# Patient Record
Sex: Female | Born: 1975 | Race: White | Hispanic: No | Smoking: Current every day smoker
Health system: Southern US, Community
[De-identification: ages and names within clinical notes are randomized; demographics above are authoritative.]

## PROBLEM LIST (undated history)

## (undated) DIAGNOSIS — N289 Disorder of kidney and ureter, unspecified: Secondary | ICD-10-CM

## (undated) DIAGNOSIS — G43909 Migraine, unspecified, not intractable, without status migrainosus: Secondary | ICD-10-CM

## (undated) DIAGNOSIS — J45909 Unspecified asthma, uncomplicated: Secondary | ICD-10-CM

## (undated) DIAGNOSIS — J189 Pneumonia, unspecified organism: Secondary | ICD-10-CM

## (undated) DIAGNOSIS — F319 Bipolar disorder, unspecified: Secondary | ICD-10-CM

## (undated) DIAGNOSIS — G8929 Other chronic pain: Secondary | ICD-10-CM

## (undated) DIAGNOSIS — F1410 Cocaine abuse, uncomplicated: Secondary | ICD-10-CM

## (undated) DIAGNOSIS — M549 Dorsalgia, unspecified: Secondary | ICD-10-CM

## (undated) HISTORY — PX: FINGER SURGERY: SHX640

## (undated) HISTORY — PX: MOUTH SURGERY: SHX715

---

## 2001-08-01 ENCOUNTER — Inpatient Hospital Stay (HOSPITAL_COMMUNITY): Admission: EM | Admit: 2001-08-01 | Discharge: 2001-08-05 | Payer: Self-pay | Admitting: Psychiatry

## 2002-05-31 ENCOUNTER — Ambulatory Visit (HOSPITAL_COMMUNITY): Admission: RE | Admit: 2002-05-31 | Discharge: 2002-05-31 | Payer: Self-pay | Admitting: *Deleted

## 2002-06-03 ENCOUNTER — Emergency Department (HOSPITAL_COMMUNITY): Admission: EM | Admit: 2002-06-03 | Discharge: 2002-06-03 | Payer: Self-pay | Admitting: Emergency Medicine

## 2003-02-09 ENCOUNTER — Inpatient Hospital Stay (HOSPITAL_COMMUNITY): Admission: AD | Admit: 2003-02-09 | Discharge: 2003-02-12 | Payer: Self-pay | Admitting: *Deleted

## 2003-05-20 ENCOUNTER — Emergency Department (HOSPITAL_COMMUNITY): Admission: EM | Admit: 2003-05-20 | Discharge: 2003-05-20 | Payer: Self-pay | Admitting: Emergency Medicine

## 2005-09-09 ENCOUNTER — Emergency Department (HOSPITAL_COMMUNITY): Admission: EM | Admit: 2005-09-09 | Discharge: 2005-09-09 | Payer: Self-pay | Admitting: Emergency Medicine

## 2007-03-22 ENCOUNTER — Emergency Department (HOSPITAL_COMMUNITY): Admission: EM | Admit: 2007-03-22 | Discharge: 2007-03-22 | Payer: Self-pay | Admitting: Emergency Medicine

## 2007-04-01 ENCOUNTER — Emergency Department (HOSPITAL_COMMUNITY): Admission: EM | Admit: 2007-04-01 | Discharge: 2007-04-01 | Payer: Self-pay | Admitting: Emergency Medicine

## 2007-07-01 ENCOUNTER — Emergency Department (HOSPITAL_COMMUNITY): Admission: EM | Admit: 2007-07-01 | Discharge: 2007-07-01 | Payer: Self-pay | Admitting: Emergency Medicine

## 2007-08-17 ENCOUNTER — Emergency Department (HOSPITAL_COMMUNITY): Admission: EM | Admit: 2007-08-17 | Discharge: 2007-08-17 | Payer: Self-pay | Admitting: Emergency Medicine

## 2007-09-13 ENCOUNTER — Emergency Department (HOSPITAL_COMMUNITY): Admission: EM | Admit: 2007-09-13 | Discharge: 2007-09-13 | Payer: Self-pay | Admitting: Emergency Medicine

## 2007-12-06 ENCOUNTER — Emergency Department (HOSPITAL_COMMUNITY): Admission: EM | Admit: 2007-12-06 | Discharge: 2007-12-06 | Payer: Self-pay | Admitting: Emergency Medicine

## 2007-12-12 ENCOUNTER — Inpatient Hospital Stay (HOSPITAL_COMMUNITY): Admission: EM | Admit: 2007-12-12 | Discharge: 2007-12-16 | Payer: Self-pay | Admitting: Emergency Medicine

## 2008-01-31 ENCOUNTER — Emergency Department (HOSPITAL_COMMUNITY): Admission: EM | Admit: 2008-01-31 | Discharge: 2008-01-31 | Payer: Self-pay | Admitting: Emergency Medicine

## 2008-04-19 ENCOUNTER — Emergency Department (HOSPITAL_COMMUNITY): Admission: EM | Admit: 2008-04-19 | Discharge: 2008-04-19 | Payer: Self-pay | Admitting: Emergency Medicine

## 2008-05-30 ENCOUNTER — Emergency Department (HOSPITAL_COMMUNITY): Admission: EM | Admit: 2008-05-30 | Discharge: 2008-05-30 | Payer: Self-pay | Admitting: Emergency Medicine

## 2008-11-03 ENCOUNTER — Emergency Department (HOSPITAL_COMMUNITY): Admission: EM | Admit: 2008-11-03 | Discharge: 2008-11-03 | Payer: Self-pay | Admitting: Emergency Medicine

## 2009-04-09 ENCOUNTER — Ambulatory Visit (HOSPITAL_COMMUNITY): Admission: RE | Admit: 2009-04-09 | Discharge: 2009-04-09 | Payer: Self-pay | Admitting: Internal Medicine

## 2009-04-24 ENCOUNTER — Ambulatory Visit (HOSPITAL_COMMUNITY): Admission: RE | Admit: 2009-04-24 | Discharge: 2009-04-24 | Payer: Self-pay | Admitting: Internal Medicine

## 2010-02-23 ENCOUNTER — Encounter: Payer: Self-pay | Admitting: *Deleted

## 2010-02-24 ENCOUNTER — Encounter: Payer: Self-pay | Admitting: Internal Medicine

## 2010-02-24 ENCOUNTER — Encounter: Payer: Self-pay | Admitting: Family Medicine

## 2010-06-18 NOTE — Discharge Summary (Signed)
NAME:  Christine Vargas, Christine Vargas NO.:  [id number]   MEDICAL RECORD NO.:  [id number]          PATIENT TYPE:  INP   LOCATION:  5039                         FACILITY:  [facility]   PHYSICIAN:  Madelynn Done, MD  DATE OF BIRTH:  07/23/1975   DATE OF ADMISSION:  12/11/2007  DATE OF DISCHARGE:  12/16/2007                               DISCHARGE SUMMARY   ADMISSION DIAGNOSES:  1. Left ring finger abscess and flexor tenosynovitis.  2. Tobacco use.   DISCHARGE DIAGNOSES:  1. Left ring finger abscess and flexor tenosynovitis.  2. Tobacco use.   CONDITION AT DISCHARGE:  Good.   SURGICAL PROCEDURES:  Left ring finger abscess and drainage as well as  incision and drainage of the flexor tendon sheath on December 12, 2007.   DISCHARGE MEDICATIONS:  1. Doxycycline 100 mg p.o. twice a day.  2. Ciprofloxacin 500 mg p.o. b.i.d.  3. Percocet 1-2 tablets every 4-6 hours as needed for pain.   REASON FOR ADMISSION:  The patient presented to the Emergency Department  after worsening infection to her left ring finger.  She was treated in  [place] and sent down from [facility] with a worsening  infection.  The patient was seen and evaluated and felt needed to go,  admitted and taken to the operating room on the day of presentation  secondary to the finger infection.   HOSPITAL COURSE:  The patient was admitted to the Orthopedic Floor  following the above procedure.  Her wound cultures grew out of  panculture micro-organism both anaerobic and gram-positive and gram-  negative organisms.  The patient was placed on oral ciprofloxacin as  well as doxycycline for the treatment.  Wound care was initiated in the  hospital with occupational therapy.  The patient was seen and examined  on hospital day #4.  She was felt ready to be discharged to home.  Her  wound looked a lot better.   HOSPITAL RECOMMENDATIONS AND DISPOSITION:  The patient will be  discharged to home.  She will be seen  back in the office in  approximately 3-4 days for repeat wound check and evaluation, continue  with above  discharge medications.  She is to return to clinic soon if she has any  worsening fevers, chills, nausea, vomiting, pain, or problems with the  hand.  Prior to her discharge, the patient's discharge instructions were  explained.  Her questions were answered.  The patient voiced  understanding the discharge instructions.      Madelynn Done, MD  Electronically Signed     FWO/MEDQ  D:  01/31/2008  T:  02/01/2008  Job:  [id number]

## 2010-06-18 NOTE — Op Note (Signed)
NAME:  Christine Vargas, Christine Vargas NO.:  [id number]   MEDICAL RECORD NO.:  [id number]          PATIENT TYPE:  INP   LOCATION:  5039                         FACILITY:  [facility]   PHYSICIAN:  Madelynn Done, MD  DATE OF BIRTH:  02/20/1975   DATE OF PROCEDURE:  12/12/2007  DATE OF DISCHARGE:                               OPERATIVE REPORT   PREOPERATIVE DIAGNOSES:  Left ring finger abscess, flexor tenosynovitis.   POSTOPERATIVE DIAGNOSES:  Left ring finger abscess, flexor  tenosynovitis.   ATTENDING SURGEON:  Madelynn Done, MD, who was scrubbed and present  for the entire procedure.   ASSISTANT SURGEON:  None.   SURGICAL PROCEDURES:  1. Drainage of a complicated and deep abscess, left ring finger.  2. Left ring finger flexor tendon sheath exploration and tendon sheath      drainage.   ANESTHESIA:  General via LMA.   INTRAOPERATIVE CULTURES:  X1, aerobic and anaerobic.   SURGICAL INDICATIONS:  Christine Vargas is a 35 year old right-hand-  dominant female who sustained a sharp laceration to her left ring finger  within this past week.  She was seen at [facility], seen by  another orthopedist, and told that if the finger got worse she was  instructed to come to the [facility].  I was consulted and saw  the patient in the emergency department and after evaluation, as the  patient was noted to have a deep abscess in the finger and possible  flexor tenosynovitis; therefore, she was taken to the operating room on  an urgent basis.  Risks, benefits, and alternatives were discussed in  detail with the patient and a signed informed consent was obtained.   Risks include but are not limited to bleeding, infection, damage to  nearby nerves, arteries, or tendons, persistent infection, stiffness in  the finger, and need for further surgical intervention.   DESCRIPTION OF PROCEDURE:  The patient was appropriately identified in  the preop holding area and a mark  with a permanent marker was made on  the left ring finger to indicate correct operative site.  The patient  was then brought back to the operating room and placed supine on the  anesthesia room table.  General anesthesia was administered via LMA.  The patient tolerated this well.  A well-padded tourniquet was then  placed on the left forearm and sealed with a 1000 drape.  The left upper  extremity was prepped with Betadine and sterilely draped.  A time-out  was called, correct site was identified, and the procedure was then  begun.  The patient did have a wound over the radial aspect of the ring  finger.  This area was extended both proximally and distally and a mid  axial incision was then made to the digit.  The neurovascular bundle was  identified and retracted volarly.  Gross purulence was encountered and  the abscess area was then drained and then wound cultures were taken.  Thorough irrigation and debridement and drainage of the abscess region  was then carried out.  The infection tracked down to the region of the  flexor  tendon sheath and the flexor tendon sheath was then opened in the  region of the A4 pulley.  The flexor tendon sheath was then opened and  thoroughly drained and washed out around the incision area.  After  drainage of the tendon sheath, the wound was then thoroughly irrigated  with 3 liters of irrigation.  A small blue vessel loop was then applied,  exiting out dorsally and then the wound was loosely reapproximated and  closed with simple 4-0 nylon sutures, 3 sutures were then used.  Adaptic  dressing and sterile compressive dressing was then applied to the  finger.  The patient tolerated this well.  She was then extubated and  taken to the recovery room in good condition.   POSTOPERATIVE PLAN:  The patient will be admitted for IV antibiotics and  await her cultures.  Wound check in approximately 48 hours.  If she  improves, may consider an oral antibiotic regimen  depending on the  cultures.  If she does not improve, the patient may need further  intervention.      Madelynn Done, MD  Electronically Signed     FWO/MEDQ  D:  12/12/2007  T:  12/12/2007  Job:  [id number]
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
## 2010-06-21 NOTE — Op Note (Signed)
NAME:  Christine Vargas, Christine Vargas                     ACCOUNT NO.:  [id number]   MEDICAL RECORD NO.:  [id number]                   PATIENT TYPE:  INP   LOCATION:  A416                                 FACILITY:  [facility]   PHYSICIAN:  Langley Gauss, M.D.                DATE OF BIRTH:  [birth year]   DATE OF PROCEDURE:  02/09/2003  DATE OF DISCHARGE:                                 OPERATIVE REPORT   DELIVERY NOTE   DIAGNOSES:  1. A 37-2/7 weeks intrauterine pregnancy.  2. Vaginal bleeding, unspecified.  3. History of cocaine use during the pregnancy, testing positive for cocaine     upon presentation in labor.  4. History of Chlamydia, treated x2 during this pregnancy.  Most recent     treatment rendered February 02, 2003 with Zithromax 1 gm p.o. x1.   PROCEDURE PERFORMED:  Spontaneous assisted vaginal delivery of a female  infant delivered over an intact perineum.  Delivery performed by Dr. Roylene Reason. Lisette Grinder.   ESTIMATED BLOOD LOSS:  Less than 500 cc.   SPECIMENS:  1. Arterial cord gas and cord blood to laboratory.  2. Placenta is examined and noted to be small but intact with a 3-vessel     umbilical cord. Placenta is sent to pathology laboratory for further     study due to the very small nature of the infant delivered.   SUMMARY:  The patient had very minimal care, presented late for prenatal  care; was very noncompliant with the prenatal care, in that a screening  ultrasound had been performed in the office for dates only.  The patient  failed to keep an appointment for an anatomic survey to be performed at  [facility].  The patient, likewise, noted to test positive for  cocaine during the pregnancy.  She was consulted extensively regarding the  adverse effects of cocaine on the pregnancy; and she also was informed that  she will be tested upon presentation in labor.  The patient likewise was  noted to test positive for Chlamydia.  She was treated with Zithromax, but  then  when retested was again noted to be testing positive.  Most recent  treatment, as stated previously, February 02, 2003 Zithromax 1 gm p.o. x1.   On this date, the patient states that she had contractions during the night,  actually did not sleep at all, secondary to the discomfort.  The final  precipitating factor was that she had vaginal bleeding on the day of  presentation.  She actually called the office complaining of vaginal  bleeding thinking that her water was broken.  She was advised to present to  [facility] at that time, which she subsequently did.   Upon evaluation at [facility] she was noted to have intact  membranes, irregular uterine contractions by external fetal monitor,  reassuring fetal heart rate, buy upon examination was noted to be 6 cm  dilated,  completely effaced and 0 station.  She was, thus, admitted in  labor.  She was noted to be GBS positive and was treated with IV Cleocin  during the course of labor secondary to PENICILLIN allergy.   Upon reaching 8 cm amniotomy was performed with placement of fetal scalp  electrode with findings of clear amniotic fluid.  The patient was noted to  have a dysfunctional labor pattern with inadequate frequency and intensity  of uterine contractions requiring Pitocin augmentation.  Thereafter she  progressed slowly along the labor curve to 9+ cm dilatation.  She did have  significant back pain during the course of labor. She requested IV Demerol;  however, due to the long half-life and active metabolite in this already  compromised infant secondary to the cocaine exposure during pregnancy it was  felt that the use of  Demerol was contraindicated.  The patient declined the  use of Nubain or Stadol, undoubtedly secondary to previous exposure to these  medications and the lack of analgesic effect secondary to her opiate  exposure. The patient did, however, reach complete dilatation.   She, thereafter, began having  the urge to push and was placed in the dorsal  lithotomy position.  She pushed during a short second stage of labor with  spontaneous rotation and then delivered in a direct OA position over an  intact perineum.  Mouth and nares were bulb suctioned of clear amniotic  fluid.  Spontaneous rotation occurred to a right anterior shoulder position.  Gentle downward traction combined with expulsive efforts results in delivery  of the infant without difficulty.  Delivery itself was uncomplicated.   The umbilical cord is milked towards the infant.  The cord is doubly  clamped, and cut; and the infant is taken to the nursery table for immediate  assessment.  Arterial cord gas and cord blood are then obtained.  Gentle  traction on the umbilical cord results in separation which, upon  examination, is an intact placenta though it is noted to be very small.  Likewise the infant is noted to be very small in size, weight is currently  pending.  Gentle traction on the umbilical cord results in separation of the  placenta with no excessive bleeding noted.   Examination of the genital tract reveals an intact perineum.  Notably the  nursing staff is informed of the recent positive Chlamydia status;  erythromycin ointment is administered and the pediatrician is to be aware of  recent positive Chlamydia culture treated, likewise of positive cocaine  testing upon presentation in labor.  GBS positive status and best  gestational age we have is 37-2/[redacted] weeks gestation. The patient, herself does  plan on bottle feeding.      ___________________________________________                                            Langley Gauss, M.D.   DC/MEDQ  D:  02/09/2003  T:  02/10/2003  Job:  [id number]

## 2010-06-21 NOTE — Discharge Summary (Signed)
NAME:  Christine Vargas, Christine Vargas                     ACCOUNT NO.:  [id number]   MEDICAL RECORD NO.:  [id number]                   PATIENT TYPE:  INP   LOCATION:  A416                                 FACILITY:  [facility]   PHYSICIAN:  Langley Gauss, M.D.                DATE OF BIRTH:  1975/02/22   DATE OF ADMISSION:  02/09/2003  DATE OF DISCHARGE:  02/12/2003                                 DISCHARGE SUMMARY   PROCEDURE PERFORMED:  Spontaneous assisted vaginal delivery performed on  February 09, 2003. See previous dictations.   PERTINENT LABORATORY STUDIES:  A positive blood type.  Cocaine positive  urine drug screen upon admission. GC and Chlamydia both negative upon  admission. Hemoglobin 14.9, hematocrit 42.9, white count 14.6. On postpartum  day #1, 11.7/33.9.   The patient is given a copy of standardized discharge instructions. She will  follow up in the office in three weeks time. She does desire permanent  sterilization. On the day of discharge, permanent sterilization was  discussed with the patient, she understands that there are reversible forms  of birth control available, but she adamantly desires permanent and  irreversible sterilization. Thus, she did sign the 30-day Medicaid papers  regarding tubal sterilization, although she does claim to be covered by a  [employer]. She is both bottle and breast feeding.   DISCHARGE MEDICATIONS:  Motrin 400 mg q.i.d. p.r.n.   HOSPITAL COURSE:  See previous dictations. The patient delivered on the p.m.  of February 09, 2003. Due to the positive screen she did not receive any  narcotics during the course of labor and she refused epidural analgesia. She  delivered in an uncomplicated manner. She was noted to be GBS positive. She  was treated with antibiotics during the course of labor. Postpartum, the  patient did well, likewise infant did well, and both were discharged home on  postpartum day #2. Gestational age by best  information available was 37  weeks. Infant weighed 4 pounds 2 ounces.     ___________________________________________                                         Langley Gauss, M.D.   DC/MEDQ  D:  02/12/2003  T:  02/13/2003  Job:  [id number]

## 2010-06-21 NOTE — H&P (Signed)
NAME:  Christine Vargas, Christine Vargas                     ACCOUNT NO.:  [id number]   MEDICAL RECORD NO.:  [id number]                   PATIENT TYPE:  INP   LOCATION:  A416                                 FACILITY:  [facility]   PHYSICIAN:  Langley Gauss, M.D.                DATE OF BIRTH:  02/17/1975   DATE OF ADMISSION:  02/09/2003  DATE OF DISCHARGE:  02/12/2003                                HISTORY & PHYSICAL   HISTORY:  A 35 year old gravida 9, para 4, four prior vaginal deliveries,  presents in labor at 37-2/7 weeks' gestation.  The patient had initially  contacted the office complaining of brownish discharge which she was felt  was some moistness in her undergarments, was advised to present to L&D.  She  presented to L&D at which time she was noted to have irregular uterine  contractions, reassuring fetal heart rate, but dilated 6 cm on examination.  The patient's prenatal course was complicated by very late prenatal care.  She did test positive for cocaine.  She also tested positive for Chlamydia  and was treated on two occasions, most recently treated February 04, 2003,  with 1 g of p.o. Zithromax.  __________ again to be performed in the course  of active labor.  The patient has had a single ultrasound performed at [redacted]  weeks gestation, which gives her current EDC.  The patient is noted to be  positive for GBS, carrier status.   PAST MEDICAL HISTORY:  Prior vaginal deliveries.   PHYSICAL EXAMINATION:  GENERAL:  No acute distress.  VITAL SIGNS:  98.4, 90, 138/105.  ABDOMEN:  Gravid uterus identified.  Fundal height 32 cm.  Vertex  presentation by Leopold's maneuvers.  PELVIC:  External genitalia, no lesions or ulcerations identified.  No  leakage of fluid, no vaginal bleeding.  External fetal monitor reassuring  fetal heart rate, contractions every 5-7 minutes.  Cervix, 6 cm dilated,  completely effaced, 0 station, intact membranes appreciated.   ASSESSMENT:  A 37-2/7 weeks' intrauterine  pregnancy, heavy cigarette smoker,  cocaine user including during the pregnancy.   PLAN:  The patient was admitted in labor.  Will give prophylaxis during the  course of labor for the group B streptococcus carrier status, with  ampicillin.  UDS, GC, and Chlamydia cultures are repeated.     ___________________________________________                                         Langley Gauss, M.D.   DC/MEDQ  D:  02/11/2003  T:  02/11/2003  Job:  [id number]
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
## 2010-09-27 ENCOUNTER — Emergency Department (HOSPITAL_COMMUNITY)
Admission: EM | Admit: 2010-09-27 | Discharge: 2010-09-27 | Disposition: A | Discharge status: Home / Self Care | Payer: Medicaid Other | Attending: Emergency Medicine | Admitting: Emergency Medicine

## 2010-09-27 ENCOUNTER — Encounter: Payer: Self-pay | Admitting: *Deleted

## 2010-09-27 DIAGNOSIS — F17200 Nicotine dependence, unspecified, uncomplicated: Secondary | ICD-10-CM | POA: Insufficient documentation

## 2010-09-27 DIAGNOSIS — R112 Nausea with vomiting, unspecified: Secondary | ICD-10-CM | POA: Insufficient documentation

## 2010-09-27 DIAGNOSIS — R55 Syncope and collapse: Secondary | ICD-10-CM | POA: Insufficient documentation

## 2010-09-27 DIAGNOSIS — R51 Headache: Secondary | ICD-10-CM | POA: Insufficient documentation

## 2010-09-27 HISTORY — DX: Migraine, unspecified, not intractable, without status migrainosus: G43.909

## 2010-09-27 HISTORY — DX: Disorder of kidney and ureter, unspecified: N28.9

## 2010-09-27 MED ORDER — ONDANSETRON HCL 4 MG PO TABS: 4.0000 mg | ORAL_TABLET | Freq: Four times a day (QID) | ORAL | Status: AC

## 2010-09-27 MED ORDER — ONDANSETRON 8 MG PO TBDP
8.0000 mg | ORAL_TABLET | Freq: Once | ORAL | Status: AC
  Administered 2010-09-27: 8 mg via ORAL
  Filled 2010-09-27: qty 1

## 2010-09-27 MED ORDER — OXYCODONE-ACETAMINOPHEN 5-325 MG PO TABS
1.0000 | ORAL_TABLET | Freq: Once | ORAL | Status: AC
  Administered 2010-09-27: 1 via ORAL
  Filled 2010-09-27: qty 1

## 2010-09-27 MED ORDER — BUTALBITAL-APAP-CAFFEINE 50-325-40 MG PO TABS: 1.0000 | ORAL_TABLET | Freq: Four times a day (QID) | ORAL | Status: AC | PRN

## 2010-09-27 NOTE — ED Notes (Signed)
C/o "migraine headache"  With nausea, photophobia, Onset two days ago--history for same---rates pain 9 on 1-10 scale

## 2010-09-27 NOTE — ED Notes (Signed)
meds given as ordered--tolerated well.  Significant other at bedside.

## 2010-09-27 NOTE — ED Notes (Signed)
Pt c/o migraine headache x 2 days. Pt c/o nausea and vomiting and photophobia. Pt states that she has had multiple migraines over the last month.

## 2010-09-27 NOTE — ED Provider Notes (Signed)
History     CSN: [id number] Arrival date & time: 09/27/2010  4:24 PM  Chief Complaint  Patient presents with  . Migraine   HPI Comments: Patient c/o headache for 2 days.  States she has hx of migraine headaches and this pain feels similar to her previous headaches.  Pain has not improved with OTC medications.  Also reports hx of frequent headaches this month.  Had vomiting intermittently yesterday but denies vomiting today.  She also denies fever, neck pain or stiffness, numbness, fever,, weakness or chest pain  Patient is a 35 y.o. female presenting with headaches. The history is provided by the patient.  Headache  This is a new problem. The current episode started 2 days ago. The problem occurs constantly. The problem has not changed since onset.The headache is associated with bright light and loud noise. The pain is located in the frontal region. The quality of the pain is described as throbbing and dull. The pain is at a severity of 9/10. The pain is moderate. The pain does not radiate. Associated symptoms include near-syncope, nausea and vomiting. Pertinent negatives include no anorexia, no fever, no malaise/fatigue, no chest pressure, no orthopnea, no palpitations and no shortness of breath. She has tried acetaminophen for the symptoms. The treatment provided no relief.    Past Medical History  Diagnosis Date  . Migraines   . Renal disorder     Past Surgical History  Procedure Date  . Finger surgery     ring finger left hand    History reviewed. No pertinent family history.  History  Substance Use Topics  . Smoking status: Current Everyday Smoker -- 0.5 packs/day  . Smokeless tobacco: Not on file  . Alcohol Use: No    OB History    Grav Para Term Preterm Abortions TAB SAB Ect Mult Living                  Review of Systems  Constitutional: Negative for fever, chills, malaise/fatigue, activity change and appetite change.  HENT: Negative for congestion, facial swelling,  sneezing, trouble swallowing, neck pain, neck stiffness, dental problem and sinus pressure.   Respiratory: Negative for shortness of breath.   Cardiovascular: Positive for near-syncope. Negative for palpitations and orthopnea.  Gastrointestinal: Positive for nausea and vomiting. Negative for anorexia.  Neurological: Positive for headaches. Negative for dizziness, weakness, light-headedness and numbness.  Hematological: Does not bruise/bleed easily.  All other systems reviewed and are negative.    Physical Exam  BP 109/56  Pulse 66  Temp(Src) 98.7 F (37.1 C) (Oral)  Resp 16  Ht 5' 2.5" (1.588 m)  Wt 126 lb (57.153 kg)  BMI 22.68 kg/m2  LMP 09/25/2010  Physical Exam  Nursing note and vitals reviewed. Constitutional: She is oriented to person, place, and time. She appears well-developed and well-nourished. No distress.  HENT:  Head: Normocephalic and atraumatic.  Right Ear: External ear normal.  Left Ear: External ear normal.  Mouth/Throat: Oropharynx is clear and moist.  Eyes: Conjunctivae and EOM are normal. Pupils are equal, round, and reactive to light.  Neck: Normal range of motion. Neck supple. No spinous process tenderness and no muscular tenderness present. No rigidity. Normal range of motion present. No Brudzinski's sign and no Kernig's sign noted. No thyromegaly present.  Cardiovascular: Normal rate, regular rhythm and normal heart sounds.   Pulmonary/Chest: Effort normal and breath sounds normal. No respiratory distress. She exhibits no tenderness.  Abdominal: Soft. She exhibits no mass. There is no tenderness.  There is no rebound and no guarding.  Musculoskeletal: She exhibits no edema and no tenderness.  Lymphadenopathy:    She has no cervical adenopathy.  Neurological: She is alert and oriented to person, place, and time. She has normal strength and normal reflexes. She is not disoriented. She displays normal reflexes. No cranial nerve deficit or sensory deficit. She  exhibits normal muscle tone. She displays a negative Romberg sign. Coordination and gait normal. GCS eye subscore is 4. GCS verbal subscore is 5. GCS motor subscore is 6.  Skin: Skin is warm and dry.    ED Course  Procedures  MDM    Medications  acetaminophen (TYLENOL) 500 MG tablet (not administered)  oxyCODONE-acetaminophen (PERCOCET) 5-325 MG per tablet 1 tablet (1 tablet Oral Given 09/27/10 1842)  ondansetron (ZOFRAN-ODT) disintegrating tablet 8 mg (8 mg Oral Given 09/27/10 1841)     1820  Patient is alert, ambulatory, NAD.  Vitals stable.  Non-toxic appearing, No meningeal signs.  No focal neuro deficits.  Hx of same.  Patient preferred not to receive IM or IV medications       Patient / Family / Caregiver understand and agree with initial ED impression and plan with expectations set for ED visit.    The patient appears reasonably screened and/or stabilized for discharge and I doubt any other medical condition or other [facility] requiring further screening, evaluation, or treatment in the ED at this time prior to discharge.     Filed Vitals:   09/27/10 1618  BP: 109/56  Pulse: 66  Temp: 98.7 F (37.1 C)  Resp: 16       Rosita Guzzetta L. [place], [place] 09/27/10 1847

## 2010-09-28 NOTE — ED Provider Notes (Signed)
History/physical exam/procedure(s) were performed by non-physician practitioner and as supervising physician I was immediately available for consultation/collaboration. I have reviewed all notes and am in agreement with care and plan.   Hilario Quarry, MD 09/28/10 1340

## 2010-11-05 LAB — POCT I-STAT, CHEM 8
BUN: 8
Calcium, Ion: 1.14
Chloride: 105
Creatinine, Ser: 0.9
Glucose, Bld: 109 — ABNORMAL HIGH
HCT: 41
Hemoglobin: 13.9
Potassium: 4.1
Sodium: 139
TCO2: 26

## 2010-11-05 LAB — DIFFERENTIAL
Band Neutrophils: 0
Basophils Absolute: 0
Basophils Relative: 2 — ABNORMAL HIGH
Blasts: 0
Eosinophils Absolute: 0.1
Eosinophils Relative: 4
Lymphocytes Relative: 50 — ABNORMAL HIGH
Lymphs Abs: 1.1
Metamyelocytes Relative: 0
Monocytes Absolute: 0.6
Monocytes Relative: 28 — ABNORMAL HIGH
Myelocytes: 0
Neutro Abs: 0.3 — ABNORMAL LOW
Neutrophils Relative %: 16 — ABNORMAL LOW
Promyelocytes Absolute: 0
nRBC: 0

## 2010-11-05 LAB — URINALYSIS, ROUTINE W REFLEX MICROSCOPIC
Bilirubin Urine: NEGATIVE
Glucose, UA: NEGATIVE
Hgb urine dipstick: NEGATIVE
Ketones, ur: NEGATIVE
Nitrite: NEGATIVE
Protein, ur: NEGATIVE
Specific Gravity, Urine: 1.019
Urobilinogen, UA: 1
pH: 8

## 2010-11-05 LAB — CBC
HCT: 41.6
Hemoglobin: 14.1
MCHC: 33.9
MCV: 88
Platelets: 340
RBC: 4.72
RDW: 11.6
WBC: 2.1 — ABNORMAL LOW

## 2010-11-05 LAB — CULTURE, ROUTINE-ABSCESS

## 2010-11-05 LAB — ANAEROBIC CULTURE

## 2010-11-05 LAB — POCT PREGNANCY, URINE: Preg Test, Ur: NEGATIVE

## 2011-10-14 ENCOUNTER — Emergency Department (HOSPITAL_COMMUNITY): Payer: Medicaid Other

## 2011-10-14 ENCOUNTER — Encounter (HOSPITAL_COMMUNITY): Payer: Self-pay | Admitting: *Deleted

## 2011-10-14 ENCOUNTER — Emergency Department (HOSPITAL_COMMUNITY)
Admission: EM | Admit: 2011-10-14 | Discharge: 2011-10-14 | Disposition: A | Discharge status: Home / Self Care | Payer: Medicaid Other | Attending: Emergency Medicine | Admitting: Emergency Medicine

## 2011-10-14 DIAGNOSIS — N289 Disorder of kidney and ureter, unspecified: Secondary | ICD-10-CM | POA: Insufficient documentation

## 2011-10-14 DIAGNOSIS — F17200 Nicotine dependence, unspecified, uncomplicated: Secondary | ICD-10-CM | POA: Insufficient documentation

## 2011-10-14 DIAGNOSIS — S60229A Contusion of unspecified hand, initial encounter: Secondary | ICD-10-CM | POA: Insufficient documentation

## 2011-10-14 DIAGNOSIS — W208XXA Other cause of strike by thrown, projected or falling object, initial encounter: Secondary | ICD-10-CM | POA: Insufficient documentation

## 2011-10-14 DIAGNOSIS — S60222A Contusion of left hand, initial encounter: Secondary | ICD-10-CM

## 2011-10-14 MED ORDER — HYDROCODONE-ACETAMINOPHEN 5-325 MG PO TABS: 1.0000 | ORAL_TABLET | Freq: Four times a day (QID) | ORAL | Status: AC | PRN

## 2011-10-14 NOTE — ED Notes (Addendum)
Pt states that a lawnmower fell on her left hand last night. Can move fingers but pain does increase. Good cap refill noted.

## 2011-10-14 NOTE — ED Provider Notes (Signed)
History     CSN: [id number]  Arrival date & time 10/14/11  1352   First MD Initiated Contact with Patient 10/14/11 1445      Chief Complaint  Patient presents with  . 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  It fell on her hand.  Has not taken any meds.    Patient is a 36 y.o. female presenting with hand injury. The history is provided by the patient. No language interpreter was used.  Hand Injury  The incident occurred yesterday. The incident occurred at home. The injury mechanism was compression. The quality of the pain is described as throbbing. The pain is at a severity of 7/10. The pain has been constant since the incident. The symptoms are aggravated by movement, use and palpation. She has tried nothing for the symptoms.    Past Medical History  Diagnosis Date  . Migraines   . Renal disorder     Past Surgical History  Procedure Date  . Finger surgery     ring finger left hand    No family history on file.  History  Substance Use Topics  . Smoking status: Current Everyday Smoker -- 0.5 packs/day  . Smokeless tobacco: Not on file  . Alcohol Use: No    OB History    Grav Para Term Preterm Abortions TAB SAB Ect Mult Living                  Review of Systems  Skin: Negative for wound.  Neurological: Negative for weakness and numbness.  All other systems reviewed and are negative.    Allergies  Ibuprofen; Penicillins; Augmentin; Elastic bandages &; Gold salts; Grapefruit concentrate; Latex; Other; Sulfa antibiotics; and Tramadol  Home Medications   Current Outpatient Rx  Name Route Sig Dispense Refill  . HYDROCODONE-ACETAMINOPHEN 5-325 MG PO TABS Oral Take 1 tablet by mouth every 6 (six) hours as needed for pain. 20 tablet 0    BP 127/108  Pulse 66  Temp 98.9 F (37.2 C) (Oral)  Resp 20  SpO2 100%  LMP  09/30/2011  Physical Exam  Nursing note and vitals reviewed. Constitutional: She is oriented to person, place, and time. She appears well-developed and well-nourished. No distress.  HENT:  Head: Normocephalic and atraumatic.  Eyes: EOM are normal.  Neck: Normal range of motion.  Cardiovascular: Normal rate, regular rhythm and normal heart sounds.   Pulmonary/Chest: Effort normal and breath sounds normal.  Abdominal: Soft. She exhibits no distension. There is no tenderness.  Musculoskeletal: She exhibits tenderness.       Hands: Neurological: She is alert and oriented to person, place, and time.  Skin: Skin is warm and dry.  Psychiatric: She has a normal mood and affect. Judgment normal.    ED Course  Procedures (including critical care time)  Labs Reviewed - No data to display Dg Hand Complete Left  10/14/2011  *RADIOLOGY REPORT*  Clinical Data: Hand injury, pain, bruising.  LEFT HAND - COMPLETE 3+ VIEW  Comparison: 01/31/2008  Findings: No acute bony abnormality.  Specifically, no fracture, subluxation, or dislocation.  Soft tissues are intact.  IMPRESSION: No acute bony abnormality.   Original Report Authenticated By: Cyndie Chime, M.D.      1. Contusion of left hand       MDM  Lenora Boys dressing x 5 days. Ice Elevation. F/u with dr. Romeo Apple prn  Evalina Field, [place] 10/23/11 1408

## 2011-10-19 NOTE — ED Provider Notes (Signed)
History     CSN: [id number]  Arrival date & time 10/14/11  1352   First MD Initiated Contact with Patient 10/14/11 1445      Chief Complaint  Patient presents with  . Hand Injury    (Consider location/radiation/quality/duration/timing/severity/associated sxs/prior treatment) HPI  Past Medical History  Diagnosis Date  . Migraines   . Renal disorder     Past Surgical History  Procedure Date  . Finger surgery     ring finger left hand    No family history on file.  History  Substance Use Topics  . Smoking status: Current Every Day Smoker -- 0.5 packs/day  . Smokeless tobacco: Not on file  . Alcohol Use: No    OB History    Grav Para Term Preterm Abortions TAB SAB Ect Mult Living                  Review of Systems  Allergies  Ibuprofen; Penicillins; Augmentin; Elastic bandages &; Gold salts; Grapefruit concentrate; Latex; Other; Sulfa antibiotics; and Tramadol  Home Medications   Current Outpatient Rx  Name Route Sig Dispense Refill  . HYDROCODONE-ACETAMINOPHEN 5-325 MG PO TABS Oral Take 1 tablet by mouth every 6 (six) hours as needed for pain. 20 tablet 0    BP 127/108  Pulse 66  Temp 98.9 F (37.2 C) (Oral)  Resp 20  SpO2 100%  LMP 09/30/2011  Physical Exam  ED Course  Procedures (including critical care time)  Labs Reviewed - No data to display No results found.   1. Contusion of left hand       MDM  Medical screening examination/treatment/procedure(s) were performed by non-physician practitioner and as supervising physician I was immediately available for consultation/collaboration.        Donnetta Hutching, MD 10/19/11 [phone]

## 2011-10-24 NOTE — ED Provider Notes (Signed)
Medical screening examination/treatment/procedure(s) were performed by non-physician practitioner and as supervising physician I was immediately available for consultation/collaboration.  Donnetta Hutching, MD 10/24/11 1902

## 2012-02-29 ENCOUNTER — Encounter (HOSPITAL_COMMUNITY): Payer: Self-pay | Admitting: Emergency Medicine

## 2012-02-29 ENCOUNTER — Emergency Department (HOSPITAL_COMMUNITY): Payer: Self-pay

## 2012-02-29 ENCOUNTER — Emergency Department (HOSPITAL_COMMUNITY)
Admission: EM | Admit: 2012-02-29 | Discharge: 2012-02-29 | Disposition: A | Discharge status: Home / Self Care | Payer: Self-pay | Attending: Emergency Medicine | Admitting: Emergency Medicine

## 2012-02-29 DIAGNOSIS — Z8742 Personal history of other diseases of the female genital tract: Secondary | ICD-10-CM | POA: Insufficient documentation

## 2012-02-29 DIAGNOSIS — R059 Cough, unspecified: Secondary | ICD-10-CM | POA: Insufficient documentation

## 2012-02-29 DIAGNOSIS — R062 Wheezing: Secondary | ICD-10-CM | POA: Insufficient documentation

## 2012-02-29 DIAGNOSIS — M25519 Pain in unspecified shoulder: Secondary | ICD-10-CM

## 2012-02-29 DIAGNOSIS — S4980XA Other specified injuries of shoulder and upper arm, unspecified arm, initial encounter: Secondary | ICD-10-CM | POA: Insufficient documentation

## 2012-02-29 DIAGNOSIS — R296 Repeated falls: Secondary | ICD-10-CM | POA: Insufficient documentation

## 2012-02-29 DIAGNOSIS — J45909 Unspecified asthma, uncomplicated: Secondary | ICD-10-CM | POA: Insufficient documentation

## 2012-02-29 DIAGNOSIS — Y929 Unspecified place or not applicable: Secondary | ICD-10-CM | POA: Insufficient documentation

## 2012-02-29 DIAGNOSIS — Z8679 Personal history of other diseases of the circulatory system: Secondary | ICD-10-CM | POA: Insufficient documentation

## 2012-02-29 DIAGNOSIS — Y939 Activity, unspecified: Secondary | ICD-10-CM | POA: Insufficient documentation

## 2012-02-29 DIAGNOSIS — S46909A Unspecified injury of unspecified muscle, fascia and tendon at shoulder and upper arm level, unspecified arm, initial encounter: Secondary | ICD-10-CM | POA: Insufficient documentation

## 2012-02-29 DIAGNOSIS — R05 Cough: Secondary | ICD-10-CM | POA: Insufficient documentation

## 2012-02-29 DIAGNOSIS — F17200 Nicotine dependence, unspecified, uncomplicated: Secondary | ICD-10-CM | POA: Insufficient documentation

## 2012-02-29 HISTORY — DX: Unspecified asthma, uncomplicated: J45.909

## 2012-02-29 MED ORDER — CYCLOBENZAPRINE HCL 10 MG PO TABS
5.0000 mg | ORAL_TABLET | Freq: Once | ORAL | Status: AC
  Administered 2012-02-29: 5 mg via ORAL
  Filled 2012-02-29: qty 1

## 2012-02-29 MED ORDER — OXYCODONE-ACETAMINOPHEN 5-325 MG PO TABS
1.0000 | ORAL_TABLET | Freq: Once | ORAL | Status: AC
  Administered 2012-02-29: 1 via ORAL
  Filled 2012-02-29: qty 1

## 2012-02-29 MED ORDER — CYCLOBENZAPRINE HCL 10 MG PO TABS: 5.0000 mg | ORAL_TABLET | Freq: Two times a day (BID) | ORAL | Status: DC | PRN

## 2012-02-29 MED ORDER — OXYCODONE-ACETAMINOPHEN 5-325 MG PO TABS: 1.0000 | ORAL_TABLET | ORAL | Status: DC | PRN

## 2012-02-29 NOTE — ED Provider Notes (Signed)
Medical screening examination/treatment/procedure(s) were performed by non-physician practitioner and as supervising physician I was immediately available for consultation/collaboration.   Celene Kras, MD 02/29/12 [phone]

## 2012-02-29 NOTE — ED Provider Notes (Signed)
History     CSN: [id number]  Arrival date & time 02/29/12  1252   First MD Initiated Contact with Patient 02/29/12 1457      Chief Complaint  Patient presents with  . Shoulder Pain    HPI Christine Vargas is a 37 y.o. female who presents to the ED with shoulder pain. The pain started today. The onset was sudden after falling and landing on concrete. The pain is located in the right shoulder. She denies any other injuries. The history was provided by the patient.  Past Medical History  Diagnosis Date  . Migraines   . Renal disorder   . Asthma     Past Surgical History  Procedure Date  . Finger surgery     ring finger left hand  . Mouth surgery     History reviewed. No pertinent family history.  History  Substance Use Topics  . Smoking status: Current Every Day Smoker -- 0.5 packs/day for 20 years    Types: Cigarettes  . Smokeless tobacco: Never Used  . Alcohol Use: No    OB History    Grav Para Term Preterm Abortions TAB SAB Ect Mult Living   7 5 5  2  2   5       Review of Systems  Constitutional: Negative for fever and chills.  HENT: Negative.   Eyes: Negative for visual disturbance.  Respiratory: Positive for cough and wheezing (smoker).   Gastrointestinal: Negative for nausea, vomiting and abdominal pain.  Musculoskeletal: Negative for back pain.       Right shoulder pain  Skin: Negative for wound.  Neurological: Negative for dizziness and headaches.  Psychiatric/Behavioral: Negative for confusion. The patient is not nervous/anxious.     Allergies  Ibuprofen; Penicillins; Augmentin; Elastic bandages &; Gold salts; Grapefruit concentrate; Latex; Other; Sulfa antibiotics; and Tramadol  Home Medications  No current outpatient prescriptions on file.  BP 112/63  Pulse 83  Temp 98.1 F (36.7 C) (Oral)  Resp 16  Ht 5\' 2"  (1.575 m)  Wt 120 lb (54.432 kg)  BMI 21.95 kg/m2  SpO2 100%  LMP 02/29/2012  Physical Exam  Nursing note and vitals  reviewed. Constitutional: She is oriented to person, place, and time. She appears well-developed and well-nourished.  HENT:  Head: Normocephalic and atraumatic.  Eyes: EOM are normal. Pupils are equal, round, and reactive to light.  Neck: Neck supple.  Cardiovascular: Normal rate and regular rhythm.   Pulmonary/Chest: Effort normal. No respiratory distress. She has wheezes.  Abdominal: Soft. There is no tenderness.  Musculoskeletal:       Right shoulder with limited range of motion. Difficulty with exam due to pain. Radial pulses strong and equal bilateral. Adequate circulation.  Neurological: She is alert and oriented to person, place, and time. No cranial nerve deficit.  Skin: Skin is warm and dry.  Psychiatric: She has a normal mood and affect. Her behavior is normal. Judgment and thought content normal.    ED Course  Procedures  Labs Reviewed - No data to display Dg Chest 2 View  02/29/2012  *RADIOLOGY REPORT*  Clinical Data: Shoulder pain  CHEST - 2 VIEW  Comparison: 11/03/2008  Findings: Cardiomediastinal silhouette is stable.  No acute infiltrate or pulmonary edema.  Stable hyperinflation and chronic mild bronchitic changes.  IMPRESSION: No active disease.  Stable hyperinflation and chronic mild bronchitic changes.   Original Report Authenticated By: Natasha Mead, M.D.    Dg Shoulder Right  02/29/2012  *RADIOLOGY REPORT*  Clinical Data: Shoulder pain, fall  RIGHT SHOULDER - 2+ VIEW  Comparison: None.  Findings: Four views of the right shoulder submitted.  No acute fracture or subluxation.  IMPRESSION: No acute fracture or subluxation.   Original Report Authenticated By: Natasha Mead, M.D.    Assessment: 37 y.o. female with right shoulder pain  Plan:  Sling   Pain management   Follow up with ortho I have reviewed this patient's vital signs, nurses notes, appropriate labs and imaging.  I have discussed findings with the patient and need for follow up.   Medication List     As of  02/29/2012  4:11 PM    START taking these medications         cyclobenzaprine 10 MG tablet   Commonly known as: FLEXERIL   Take 0.5 tablets (5 mg total) by mouth 2 (two) times daily as needed for muscle spasms.      oxyCODONE-acetaminophen 5-325 MG per tablet   Commonly known as: PERCOCET/ROXICET   Take 1 tablet by mouth every 4 (four) hours as needed for pain.          Where to get your medications    These are the prescriptions that you need to pick up.   You may get these medications from any pharmacy.         cyclobenzaprine 10 MG tablet   oxyCODONE-acetaminophen 5-325 MG per tablet                 Janne Napoleon, NP 02/29/12 1611

## 2012-02-29 NOTE — ED Notes (Signed)
Patient c/o right shoulder pain after falling and landing on concrete today. Per patient fell on right side. Hit head but denies LOC, dizziness, or blurred vision.

## 2012-02-29 NOTE — ED Notes (Signed)
Pt c/o right sided shoulder pain after a fall this morning. Pt is able to move extremity but reports pain when doing so.

## 2012-03-08 ENCOUNTER — Encounter (HOSPITAL_COMMUNITY): Payer: Self-pay | Admitting: Emergency Medicine

## 2012-03-08 ENCOUNTER — Emergency Department (HOSPITAL_COMMUNITY)
Admission: EM | Admit: 2012-03-08 | Discharge: 2012-03-08 | Disposition: A | Discharge status: Home / Self Care | Payer: Self-pay | Attending: Emergency Medicine | Admitting: Emergency Medicine

## 2012-03-08 DIAGNOSIS — Z8679 Personal history of other diseases of the circulatory system: Secondary | ICD-10-CM | POA: Insufficient documentation

## 2012-03-08 DIAGNOSIS — F17200 Nicotine dependence, unspecified, uncomplicated: Secondary | ICD-10-CM | POA: Insufficient documentation

## 2012-03-08 DIAGNOSIS — Z87448 Personal history of other diseases of urinary system: Secondary | ICD-10-CM | POA: Insufficient documentation

## 2012-03-08 DIAGNOSIS — S46001A Unspecified injury of muscle(s) and tendon(s) of the rotator cuff of right shoulder, initial encounter: Secondary | ICD-10-CM

## 2012-03-08 DIAGNOSIS — W1809XA Striking against other object with subsequent fall, initial encounter: Secondary | ICD-10-CM | POA: Insufficient documentation

## 2012-03-08 DIAGNOSIS — J45909 Unspecified asthma, uncomplicated: Secondary | ICD-10-CM | POA: Insufficient documentation

## 2012-03-08 DIAGNOSIS — Y929 Unspecified place or not applicable: Secondary | ICD-10-CM | POA: Insufficient documentation

## 2012-03-08 DIAGNOSIS — S46909A Unspecified injury of unspecified muscle, fascia and tendon at shoulder and upper arm level, unspecified arm, initial encounter: Secondary | ICD-10-CM | POA: Insufficient documentation

## 2012-03-08 DIAGNOSIS — Y939 Activity, unspecified: Secondary | ICD-10-CM | POA: Insufficient documentation

## 2012-03-08 DIAGNOSIS — S4980XA Other specified injuries of shoulder and upper arm, unspecified arm, initial encounter: Secondary | ICD-10-CM | POA: Insufficient documentation

## 2012-03-08 DIAGNOSIS — Z9889 Other specified postprocedural states: Secondary | ICD-10-CM | POA: Insufficient documentation

## 2012-03-08 MED ORDER — CYCLOBENZAPRINE HCL 10 MG PO TABS: 10.0000 mg | ORAL_TABLET | Freq: Two times a day (BID) | ORAL | Status: DC | PRN

## 2012-03-08 MED ORDER — OXYCODONE-ACETAMINOPHEN 5-325 MG PO TABS: 1.0000 | ORAL_TABLET | Freq: Four times a day (QID) | ORAL | Status: DC | PRN

## 2012-03-08 NOTE — ED Provider Notes (Addendum)
History   This chart was scribed for Shelda Jakes, MD, by Frederik Pear, ER scribe. The patient was seen in room TR05C/TR05C and the patient's care was started at 1826.    CSN: [id number]  Arrival date & time 03/08/12  1641   First MD Initiated Contact with Patient 03/08/12 1826      Chief Complaint  Patient presents with  . Shoulder Pain    (Consider location/radiation/quality/duration/timing/severity/associated sxs/prior treatment) HPI  Christine Vargas is a 37 y.o. female who presents to the Emergency Department complaining of constant, stabbing right shoulder pain that is aggravated by laying on it and lifting her arm above her should and began after she fell on it and landed on concrete 1 week ago. She reports that she was seen at [facility] on 01/26 and had an X-ray and CT completed, which were both negative. She returned to the ED today because she is still experiencing pain. She has been treating the pain with Flexeril and Percocet that were prescribed at [facility], which have been providing her relief.  Past Medical History  Diagnosis Date  . Migraines   . Renal disorder   . Asthma     Past Surgical History  Procedure Date  . Finger surgery     ring finger left hand  . Mouth surgery     History reviewed. No pertinent family history.  History  Substance Use Topics  . Smoking status: Current Every Day Smoker -- 0.5 packs/day for 20 years    Types: Cigarettes  . Smokeless tobacco: Never Used  . Alcohol Use: No    OB History    Grav Para Term Preterm Abortions TAB SAB Ect Mult Living   7 5 5  2  2   5       Review of Systems  Constitutional: Negative for fever.  HENT: Negative for neck pain.   Respiratory: Negative for shortness of breath.   Cardiovascular: Negative for chest pain.  Gastrointestinal: Negative for nausea, vomiting, abdominal pain and diarrhea.  Musculoskeletal: Negative for back pain.       Shoulder pain  Neurological: Negative for  numbness.  Hematological: Does not bruise/bleed easily.  All other systems reviewed and are negative.    Allergies  Ibuprofen; Penicillins; Augmentin; Elastic bandages &; Gold salts; Grapefruit concentrate; Latex; Other; Sulfa antibiotics; and Tramadol  Home Medications   Current Outpatient Rx  Name  Route  Sig  Dispense  Refill  . CYCLOBENZAPRINE HCL 10 MG PO TABS   Oral   Take 0.5 tablets (5 mg total) by mouth 2 (two) times daily as needed for muscle spasms.   20 tablet   0   . OXYCODONE-ACETAMINOPHEN 5-325 MG PO TABS   Oral   Take 1 tablet by mouth every 4 (four) hours as needed for pain.   15 tablet   0   . CYCLOBENZAPRINE HCL 10 MG PO TABS   Oral   Take 1 tablet (10 mg total) by mouth 2 (two) times daily as needed for muscle spasms.   20 tablet   0   . OXYCODONE-ACETAMINOPHEN 5-325 MG PO TABS   Oral   Take 1-2 tablets by mouth every 6 (six) hours as needed for pain.   15 tablet   0     BP 113/63  Pulse 92  Temp 98.1 F (36.7 C) (Oral)  Resp 18  SpO2 96%  LMP 02/29/2012  Physical Exam  Nursing note and vitals reviewed. Constitutional: She appears well-developed  and well-nourished.  HENT:  Head: Normocephalic and atraumatic.  Neck: Normal range of motion. Neck supple.  Cardiovascular: Normal rate and regular rhythm.   Pulmonary/Chest: Effort normal. No respiratory distress.  Abdominal: Soft. Bowel sounds are normal. There is no tenderness.  Musculoskeletal: She exhibits tenderness.       Left radial pulse is 2+ Right radial pulse is 2+. Left hand is dominant. Cap refill is 1 second. Tender around the right shoulder.  Skin: Skin is warm and dry.  Psychiatric: She has a normal mood and affect. Thought content normal.    ED Course  Procedures (including critical care time)  DIAGNOSTIC STUDIES: Oxygen Saturation is 96% on room air, adequate by my interpretation.    COORDINATION OF CARE:  18:51- Discussed planned course of treatment with the  patient, including placing the arm in a sling, who is agreeable at this time.   Labs Reviewed - No data to display No results found.   1. Injury of right rotator cuff       MDM  Patient was seen at [facility] 02/29/12,  had x-rays of her chest and shoulder they were negative for any bony injuries. Patient clinically seems to have a rotator cuff injury of her right shoulder. She already has referral to orthopedics in [place]. Patient not wearing shoulder immobilizer needs to wear that. We'll renew her pain medication and Flexeril. Patient aware that we can't keep renewing it to the ED she needs to followup with orthopedics.   I personally performed the services described in this documentation, which was scribed in my presence. The recorded information has been reviewed and is accurate.        Shelda Jakes, MD 03/08/12 1859  Shelda Jakes, MD 03/09/12 [phone]

## 2012-03-08 NOTE — ED Notes (Signed)
Pt c/o right shoulder pain from fall 1 week ago; pt sts seen at [facility] and had CT and xray done then that were negative; pt sts still having pain

## 2012-03-08 NOTE — ED Notes (Signed)
Ortho called for right shoulder immobilizer

## 2012-03-08 NOTE — Progress Notes (Signed)
Orthopedic Tech Progress Note Patient Details:  Christine Vargas 1975-02-25 [id number]  Ortho Devices Type of Ortho Device: Arm sling Ortho Device/Splint Location: (R) UE Ortho Device/Splint Interventions: Application   Jennye Moccasin 03/08/2012, 7:20 PM

## 2012-04-01 ENCOUNTER — Emergency Department (HOSPITAL_COMMUNITY): Payer: Self-pay

## 2012-04-01 ENCOUNTER — Encounter (HOSPITAL_COMMUNITY): Payer: Self-pay | Admitting: *Deleted

## 2012-04-01 ENCOUNTER — Emergency Department (HOSPITAL_COMMUNITY)
Admission: EM | Admit: 2012-04-01 | Discharge: 2012-04-01 | Disposition: A | Discharge status: Home / Self Care | Payer: Self-pay | Attending: Emergency Medicine | Admitting: Emergency Medicine

## 2012-04-01 DIAGNOSIS — J45901 Unspecified asthma with (acute) exacerbation: Secondary | ICD-10-CM | POA: Insufficient documentation

## 2012-04-01 DIAGNOSIS — R0789 Other chest pain: Secondary | ICD-10-CM | POA: Insufficient documentation

## 2012-04-01 DIAGNOSIS — IMO0001 Reserved for inherently not codable concepts without codable children: Secondary | ICD-10-CM | POA: Insufficient documentation

## 2012-04-01 DIAGNOSIS — Z3202 Encounter for pregnancy test, result negative: Secondary | ICD-10-CM | POA: Insufficient documentation

## 2012-04-01 DIAGNOSIS — R509 Fever, unspecified: Secondary | ICD-10-CM | POA: Insufficient documentation

## 2012-04-01 DIAGNOSIS — K529 Noninfective gastroenteritis and colitis, unspecified: Secondary | ICD-10-CM

## 2012-04-01 DIAGNOSIS — Z8701 Personal history of pneumonia (recurrent): Secondary | ICD-10-CM | POA: Insufficient documentation

## 2012-04-01 DIAGNOSIS — F17200 Nicotine dependence, unspecified, uncomplicated: Secondary | ICD-10-CM | POA: Insufficient documentation

## 2012-04-01 DIAGNOSIS — Z87448 Personal history of other diseases of urinary system: Secondary | ICD-10-CM | POA: Insufficient documentation

## 2012-04-01 DIAGNOSIS — J40 Bronchitis, not specified as acute or chronic: Secondary | ICD-10-CM

## 2012-04-01 DIAGNOSIS — N390 Urinary tract infection, site not specified: Secondary | ICD-10-CM

## 2012-04-01 DIAGNOSIS — R197 Diarrhea, unspecified: Secondary | ICD-10-CM | POA: Insufficient documentation

## 2012-04-01 DIAGNOSIS — K5289 Other specified noninfective gastroenteritis and colitis: Secondary | ICD-10-CM | POA: Insufficient documentation

## 2012-04-01 DIAGNOSIS — J3489 Other specified disorders of nose and nasal sinuses: Secondary | ICD-10-CM | POA: Insufficient documentation

## 2012-04-01 DIAGNOSIS — Z8679 Personal history of other diseases of the circulatory system: Secondary | ICD-10-CM | POA: Insufficient documentation

## 2012-04-01 DIAGNOSIS — Z79899 Other long term (current) drug therapy: Secondary | ICD-10-CM | POA: Insufficient documentation

## 2012-04-01 DIAGNOSIS — R1110 Vomiting, unspecified: Secondary | ICD-10-CM | POA: Insufficient documentation

## 2012-04-01 HISTORY — DX: Pneumonia, unspecified organism: J18.9

## 2012-04-01 LAB — CBC WITH DIFFERENTIAL/PLATELET
Basophils Absolute: 0 10*3/uL (ref 0.0–0.1)
Basophils Relative: 1 % (ref 0–1)
Eosinophils Absolute: 0 10*3/uL (ref 0.0–0.7)
Eosinophils Relative: 0 % (ref 0–5)
HCT: 40.3 % (ref 36.0–46.0)
Hemoglobin: 15 g/dL (ref 12.0–15.0)
Lymphocytes Relative: 21 % (ref 12–46)
Lymphs Abs: 1.1 10*3/uL (ref 0.7–4.0)
MCH: 30.9 pg (ref 26.0–34.0)
MCHC: 37.2 g/dL — ABNORMAL HIGH (ref 30.0–36.0)
MCV: 83.1 fL (ref 78.0–100.0)
Monocytes Absolute: 1 10*3/uL (ref 0.1–1.0)
Monocytes Relative: 19 % — ABNORMAL HIGH (ref 3–12)
Neutro Abs: 3.2 10*3/uL (ref 1.7–7.7)
Neutrophils Relative %: 60 % (ref 43–77)
Platelets: 234 10*3/uL (ref 150–400)
RBC: 4.85 MIL/uL (ref 3.87–5.11)
RDW: 11.7 % (ref 11.5–15.5)
WBC: 5.4 10*3/uL (ref 4.0–10.5)

## 2012-04-01 LAB — BASIC METABOLIC PANEL
BUN: 14 mg/dL (ref 6–23)
CO2: 21 mEq/L (ref 19–32)
Calcium: 9.1 mg/dL (ref 8.4–10.5)
Chloride: 95 mEq/L — ABNORMAL LOW (ref 96–112)
Creatinine, Ser: 0.53 mg/dL (ref 0.50–1.10)
GFR calc Af Amer: >90 mL/min (ref >90)
GFR calc non Af Amer: >90 mL/min (ref >90)
Glucose, Bld: 102 mg/dL — ABNORMAL HIGH (ref 70–99)
Potassium: 3.2 mEq/L — ABNORMAL LOW (ref 3.5–5.1)
Sodium: 133 mEq/L — ABNORMAL LOW (ref 135–145)

## 2012-04-01 LAB — URINALYSIS, ROUTINE W REFLEX MICROSCOPIC
Bilirubin Urine: NEGATIVE
Glucose, UA: NEGATIVE mg/dL
Ketones, ur: >80 mg/dL — AB
Leukocytes, UA: NEGATIVE
Nitrite: POSITIVE — AB
Protein, ur: 30 mg/dL — AB
Specific Gravity, Urine: 1.025 (ref 1.005–1.030)
Urobilinogen, UA: 0.2 mg/dL (ref 0.0–1.0)
pH: 6 (ref 5.0–8.0)

## 2012-04-01 LAB — TROPONIN I: Troponin I: <0.3 ng/mL (ref <0.30)

## 2012-04-01 LAB — POCT PREGNANCY, URINE: Preg Test, Ur: NEGATIVE

## 2012-04-01 LAB — URINE MICROSCOPIC-ADD ON

## 2012-04-01 MED ORDER — ONDANSETRON HCL 4 MG/2ML IJ SOLN
4.0000 mg | Freq: Once | INTRAMUSCULAR | Status: AC
  Administered 2012-04-01: 4 mg via INTRAVENOUS
  Filled 2012-04-01: qty 2

## 2012-04-01 MED ORDER — PROMETHAZINE HCL 25 MG PO TABS: 25.0000 mg | ORAL_TABLET | Freq: Four times a day (QID) | ORAL | Status: DC | PRN

## 2012-04-01 MED ORDER — SODIUM CHLORIDE 0.9 % IV BOLUS (SEPSIS)
1000.0000 mL | Freq: Once | INTRAVENOUS | Status: AC
  Administered 2012-04-01: 1000 mL via INTRAVENOUS

## 2012-04-01 MED ORDER — SODIUM CHLORIDE 0.9 % IV SOLN: INTRAVENOUS | Status: DC

## 2012-04-01 MED ORDER — CIPROFLOXACIN HCL 500 MG PO TABS: 500.0000 mg | ORAL_TABLET | Freq: Two times a day (BID) | ORAL | Status: DC

## 2012-04-01 MED ORDER — NITROFURANTOIN MONOHYD MACRO 100 MG PO CAPS: 100.0000 mg | ORAL_CAPSULE | Freq: Two times a day (BID) | ORAL | Status: DC

## 2012-04-01 MED ORDER — HYDROCODONE-ACETAMINOPHEN 5-325 MG PO TABS: 1.0000 | ORAL_TABLET | Freq: Four times a day (QID) | ORAL | Status: DC | PRN

## 2012-04-01 MED ORDER — ALBUTEROL SULFATE (2.5 MG/3ML) 0.083% IN NEBU: 2.5000 mg | INHALATION_SOLUTION | Freq: Four times a day (QID) | RESPIRATORY_TRACT | Status: DC | PRN

## 2012-04-01 MED ORDER — CIPROFLOXACIN IN D5W 400 MG/200ML IV SOLN
400.0000 mg | Freq: Once | INTRAVENOUS | Status: AC
  Administered 2012-04-01: 400 mg via INTRAVENOUS
  Filled 2012-04-01: qty 200

## 2012-04-01 MED ORDER — ALBUTEROL SULFATE HFA 108 (90 BASE) MCG/ACT IN AERS
2.0000 | INHALATION_SPRAY | Freq: Four times a day (QID) | RESPIRATORY_TRACT | Status: DC
  Administered 2012-04-01: 2 via RESPIRATORY_TRACT
  Filled 2012-04-01: qty 6.7

## 2012-04-01 MED ORDER — HYDROMORPHONE HCL PF 1 MG/ML IJ SOLN
1.0000 mg | Freq: Once | INTRAMUSCULAR | Status: AC
  Administered 2012-04-01: 1 mg via INTRAVENOUS
  Filled 2012-04-01: qty 1

## 2012-04-01 NOTE — ED Provider Notes (Addendum)
History     CSN: [id number]  Arrival date & time 04/01/12  1359   First MD Initiated Contact with Patient 04/01/12 1512      Chief Complaint  Patient presents with  . Shortness of Breath    (Consider location/radiation/quality/duration/timing/severity/associated sxs/prior treatment) The history is provided by the patient.  patient with a complaint of shortness of breath chest wall discomfort with cough for 4 days. Yesterday started vomiting and having diarrhea. Also with complaint of fever. Patient is worried about having pneumonia. The chest pain is in the lower part of the chest anteriorly and bilaterally and worse with coughing. Pain is described as 8/10 nonradiating  Past Medical History  Diagnosis Date  . Migraines   . Asthma   . Renal disorder   . Pneumonia   . Pregnant     Past Surgical History  Procedure Laterality Date  . Finger surgery      ring finger left hand  . Mouth surgery      History reviewed. No pertinent family history.  History  Substance Use Topics  . Smoking status: Current Every Day Smoker -- 0.50 packs/day for 20 years    Types: Cigarettes  . Smokeless tobacco: Never Used  . Alcohol Use: No    OB History   Grav Para Term Preterm Abortions TAB SAB Ect Mult Living   7 5 5  2  2   5       Review of Systems  Constitutional: Positive for fever.  HENT: Positive for congestion.   Eyes: Negative for visual disturbance.  Cardiovascular: Positive for chest pain.  Genitourinary: Negative for dysuria and hematuria.  Musculoskeletal: Positive for myalgias.  Skin: Negative for rash.  Neurological: Negative for headaches.  Hematological: Does not bruise/bleed easily.    Allergies  Ibuprofen; Augmentin; Elastic bandages &; Gold salts; Grapefruit concentrate; Latex; Other; Penicillins; Sulfa antibiotics; and Tramadol  Home Medications   Current Outpatient Rx  Name  Route  Sig  Dispense  Refill  . acetaminophen (TYLENOL) 500 MG tablet    Oral   Take 1,000 mg by mouth at bedtime as needed for pain.         Marland Kitchen ALPRAZolam (XANAX) 1 MG tablet   Oral   Take 0.5 mg by mouth daily as needed for anxiety.         . cyclobenzaprine (FLEXERIL) 10 MG tablet   Oral   Take 1 tablet (10 mg total) by mouth 2 (two) times daily as needed for muscle spasms.   20 tablet   0   . oxyCODONE-acetaminophen (PERCOCET/ROXICET) 5-325 MG per tablet   Oral   Take 1-2 tablets by mouth every 6 (six) hours as needed for pain.   15 tablet   0   . albuterol (PROVENTIL) (2.5 MG/3ML) 0.083% nebulizer solution   Nebulization   Take 3 mLs (2.5 mg total) by nebulization every 6 (six) hours as needed for wheezing.   75 mL   6   . ciprofloxacin (CIPRO) 500 MG tablet   Oral   Take 1 tablet (500 mg total) by mouth 2 (two) times daily.   14 tablet   0   . HYDROcodone-acetaminophen (NORCO/VICODIN) 5-325 MG per tablet   Oral   Take 1-2 tablets by mouth every 6 (six) hours as needed for pain.   10 tablet   0   . nitrofurantoin, macrocrystal-monohydrate, (MACROBID) 100 MG capsule   Oral   Take 1 capsule (100 mg total) by mouth 2 (two)  times daily.   14 capsule   0   . promethazine (PHENERGAN) 25 MG tablet   Oral   Take 1 tablet (25 mg total) by mouth every 6 (six) hours as needed for nausea.   12 tablet   0     BP 134/83  Pulse 103  Temp(Src) 98.7 F (37.1 C) (Oral)  Resp 32  Ht 5\' 2"  (1.575 m)  Wt 122 lb (55.339 kg)  BMI 22.31 kg/m2  SpO2 99%  LMP 02/26/2012  Physical Exam  Nursing note and vitals reviewed. Constitutional: She is oriented to person, place, and time. She appears well-developed and well-nourished. She appears distressed.  HENT:  Head: Normocephalic and atraumatic.  Next membranes are dry.  Eyes: Conjunctivae are normal. Pupils are equal, round, and reactive to light.  Neck: Normal range of motion. Neck supple.  Cardiovascular: Normal rate, regular rhythm and normal heart sounds.   No murmur  heard. Pulmonary/Chest: Effort normal and breath sounds normal. No respiratory distress. She has no wheezes. She has no rales.  Abdominal: Soft. Bowel sounds are normal. There is no tenderness.  Musculoskeletal: Normal range of motion. She exhibits no edema.  Neurological: She is alert and oriented to person, place, and time. No cranial nerve deficit. She exhibits normal muscle tone. Coordination normal.  Skin: Skin is warm. No rash noted.    ED Course  Procedures (including critical care time)  Labs Reviewed  CBC WITH DIFFERENTIAL - Abnormal; Notable for the following:    MCHC 37.2 (*)    Monocytes Relative 19 (*)    All other components within normal limits  BASIC METABOLIC PANEL - Abnormal; Notable for the following:    Sodium 133 (*)    Potassium 3.2 (*)    Chloride 95 (*)    Glucose, Bld 102 (*)    All other components within normal limits  URINALYSIS, ROUTINE W REFLEX MICROSCOPIC - Abnormal; Notable for the following:    Hgb urine dipstick MODERATE (*)    Ketones, ur >80 (*)    Protein, ur 30 (*)    Nitrite POSITIVE (*)    All other components within normal limits  URINE MICROSCOPIC-ADD ON - Abnormal; Notable for the following:    Bacteria, UA MANY (*)    All other components within normal limits  URINE CULTURE  TROPONIN I  POCT PREGNANCY, URINE   Dg Chest 2 View  04/01/2012  *RADIOLOGY REPORT*  Clinical Data: Shortness of breath and cough.  CHEST - 2 VIEW  Comparison: None.  Findings: Mediastinal silhouette is within normal limits for size and stable.  There is no acute infiltrate or failure.  Moderate hyperinflation with chronic bronchitic change but no superimposed acute process.  Bones unremarkable.  IMPRESSION: COPD.  No active disease.   Original Report Authenticated By: Davonna Belling, M.D.    Results for orders placed during the hospital encounter of 04/01/12  CBC WITH DIFFERENTIAL      Result Value Range   WBC 5.4  4.0 - 10.5 K/uL   RBC 4.85  3.87 - 5.11 MIL/uL    Hemoglobin 15.0  12.0 - 15.0 g/dL   HCT 29.5  62.1 - 30.8 %   MCV 83.1  78.0 - 100.0 fL   MCH 30.9  26.0 - 34.0 pg   MCHC 37.2 (*) 30.0 - 36.0 g/dL   RDW 65.7  84.6 - 96.2 %   Platelets 234  150 - 400 K/uL   Neutrophils Relative 60  43 - 77 %  Neutro Abs 3.2  1.7 - 7.7 K/uL   Lymphocytes Relative 21  12 - 46 %   Lymphs Abs 1.1  0.7 - 4.0 K/uL   Monocytes Relative 19 (*) 3 - 12 %   Monocytes Absolute 1.0  0.1 - 1.0 K/uL   Eosinophils Relative 0  0 - 5 %   Eosinophils Absolute 0.0  0.0 - 0.7 K/uL   Basophils Relative 1  0 - 1 %   Basophils Absolute 0.0  0.0 - 0.1 K/uL  BASIC METABOLIC PANEL      Result Value Range   Sodium 133 (*) 135 - 145 mEq/L   Potassium 3.2 (*) 3.5 - 5.1 mEq/L   Chloride 95 (*) 96 - 112 mEq/L   CO2 21  19 - 32 mEq/L   Glucose, Bld 102 (*) 70 - 99 mg/dL   BUN 14  6 - 23 mg/dL   Creatinine, Ser 1.61  0.50 - 1.10 mg/dL   Calcium 9.1  8.4 - 09.6 mg/dL   GFR calc non Af Amer >90  >90 mL/min   GFR calc Af Amer >90  >90 mL/min  TROPONIN I      Result Value Range   Troponin I <0.30  <0.30 ng/mL  URINALYSIS, ROUTINE W REFLEX MICROSCOPIC      Result Value Range   Color, Urine YELLOW  YELLOW   APPearance CLEAR  CLEAR   Specific Gravity, Urine 1.025  1.005 - 1.030   pH 6.0  5.0 - 8.0   Glucose, UA NEGATIVE  NEGATIVE mg/dL   Hgb urine dipstick MODERATE (*) NEGATIVE   Bilirubin Urine NEGATIVE  NEGATIVE   Ketones, ur >80 (*) NEGATIVE mg/dL   Protein, ur 30 (*) NEGATIVE mg/dL   Urobilinogen, UA 0.2  0.0 - 1.0 mg/dL   Nitrite POSITIVE (*) NEGATIVE   Leukocytes, UA NEGATIVE  NEGATIVE  URINE MICROSCOPIC-ADD ON      Result Value Range   WBC, UA 0-2  <3 WBC/hpf   RBC / HPF 7-10  <3 RBC/hpf   Bacteria, UA MANY (*) RARE  POCT PREGNANCY, URINE      Result Value Range   Preg Test, Ur NEGATIVE  NEGATIVE      Date: 04/01/2012  Rate: 90  Rhythm: normal sinus rhythm  QRS Axis: normal  Intervals: PR shortened  ST/T Wave abnormalities: nonspecific ST/T changes   Conduction Disutrbances:none  Narrative Interpretation:   Old EKG Reviewed: none available    1. Bronchitis   2. Gastroenteritis   3. Urinary tract infection       MDM   Patient presented with the shortness of breath and cough for 3-4 days concerned that she had pneumonia. Then starting yesterday she started with vomiting and diarrhea. She has chest pain is made worse with cough and similar musculoskeletal. Also subjectively complains of fever. Workup in emergency negative for pneumonia. Will treat as if it's a bronchitis. Urinalysis is suggestive urinary tract infection may be with the vomiting perhaps early pyelonephritis. Was given one dose of IV Cipro in the emergency department. We'll send home with oral Cipro patient did request Macrobid in case she couldn't afford the Cipro social that was provided as well does not need to take both. Chest pain workup without any significant findings EKG did have some nausea the ST and T wave changes but troponin was negative. She's had chest pain for 3-4 days. Chest pain is more consistent with chest wall pain.  In addition patient clinically appeared dry  received 2 L of normal saline. Patient improved at time of discharge.      Shelda Jakes, MD 04/01/12 Ernestina [place]  Shelda Jakes, MD 04/04/12 1052

## 2012-04-01 NOTE — ED Notes (Signed)
Patient stated there was a possibility she may be pregnant. RN made aware.

## 2012-04-01 NOTE — ED Notes (Addendum)
Feels sob, hurting in chest,  Cough, thinks she has pneumonia.  Fever.vomiting.

## 2012-04-03 LAB — URINE CULTURE: Colony Count: >=100000

## 2012-04-04 ENCOUNTER — Telehealth (HOSPITAL_COMMUNITY): Payer: Self-pay | Admitting: Emergency Medicine

## 2012-04-04 NOTE — ED Notes (Signed)
Patient has +Urine culture. Checking to see if appropriately treated. °

## 2012-04-04 NOTE — ED Notes (Signed)
+  Urine. Patient treated with Cipro. Sensitive to same. Per protocol MD. °

## 2012-05-19 ENCOUNTER — Encounter (HOSPITAL_COMMUNITY): Payer: Self-pay | Admitting: Emergency Medicine

## 2012-05-19 ENCOUNTER — Emergency Department (HOSPITAL_COMMUNITY): Payer: Self-pay

## 2012-05-19 ENCOUNTER — Emergency Department (HOSPITAL_COMMUNITY)
Admission: EM | Admit: 2012-05-19 | Discharge: 2012-05-19 | Disposition: A | Discharge status: Home / Self Care | Payer: Self-pay | Attending: Emergency Medicine | Admitting: Emergency Medicine

## 2012-05-19 DIAGNOSIS — Z8701 Personal history of pneumonia (recurrent): Secondary | ICD-10-CM | POA: Insufficient documentation

## 2012-05-19 DIAGNOSIS — F17200 Nicotine dependence, unspecified, uncomplicated: Secondary | ICD-10-CM | POA: Insufficient documentation

## 2012-05-19 DIAGNOSIS — S7010XA Contusion of unspecified thigh, initial encounter: Secondary | ICD-10-CM | POA: Insufficient documentation

## 2012-05-19 DIAGNOSIS — S7000XA Contusion of unspecified hip, initial encounter: Secondary | ICD-10-CM | POA: Insufficient documentation

## 2012-05-19 DIAGNOSIS — Z87448 Personal history of other diseases of urinary system: Secondary | ICD-10-CM | POA: Insufficient documentation

## 2012-05-19 DIAGNOSIS — Y929 Unspecified place or not applicable: Secondary | ICD-10-CM | POA: Insufficient documentation

## 2012-05-19 DIAGNOSIS — W1809XA Striking against other object with subsequent fall, initial encounter: Secondary | ICD-10-CM

## 2012-05-19 DIAGNOSIS — W11XXXA Fall on and from ladder, initial encounter: Secondary | ICD-10-CM | POA: Insufficient documentation

## 2012-05-19 DIAGNOSIS — J45909 Unspecified asthma, uncomplicated: Secondary | ICD-10-CM | POA: Insufficient documentation

## 2012-05-19 DIAGNOSIS — IMO0002 Reserved for concepts with insufficient information to code with codable children: Secondary | ICD-10-CM | POA: Insufficient documentation

## 2012-05-19 DIAGNOSIS — Y939 Activity, unspecified: Secondary | ICD-10-CM | POA: Insufficient documentation

## 2012-05-19 DIAGNOSIS — S7001XA Contusion of right hip, initial encounter: Secondary | ICD-10-CM

## 2012-05-19 DIAGNOSIS — Z79899 Other long term (current) drug therapy: Secondary | ICD-10-CM | POA: Insufficient documentation

## 2012-05-19 MED ORDER — ALBUTEROL SULFATE HFA 108 (90 BASE) MCG/ACT IN AERS: 1.0000 | INHALATION_SPRAY | Freq: Four times a day (QID) | RESPIRATORY_TRACT | Status: DC | PRN

## 2012-05-19 MED ORDER — EPINEPHRINE 0.3 MG/0.3ML IJ DEVI: 0.3000 mg | INTRAMUSCULAR | Status: DC | PRN

## 2012-05-19 MED ORDER — HYDROCODONE-ACETAMINOPHEN 5-325 MG PO TABS: 1.0000 | ORAL_TABLET | ORAL | Status: DC | PRN

## 2012-05-19 NOTE — ED Provider Notes (Signed)
History     CSN: [id number]  Arrival date & time 05/19/12  1058   First MD Initiated Contact with Patient 05/19/12 1102      Chief Complaint  Patient presents with  . Back Pain    (Consider location/radiation/quality/duration/timing/severity/associated sxs/prior treatment) HPI  Patient presents to the ED with complaints of left hip pain after fall from ladder. She was on the 2nd rung when her foot slipped. She said she landed on her right hip but is now having left hip pain. She has back problems as it is and right shoulder problems. She said that it hurt right away and since then she has been walking really slow and having pain radiate from her left hip down to her left knee. She denies any numbness or tingling to any of her extremities. Denies any fevers, hx of IV drug use. No fevers, weakness. nad vss  Past Medical History  Diagnosis Date  . Migraines   . Asthma   . Renal disorder   . Pneumonia   . Pregnant     Past Surgical History  Procedure Laterality Date  . Finger surgery      ring finger left hand  . Mouth surgery      History reviewed. No pertinent family history.  History  Substance Use Topics  . Smoking status: Current Every Day Smoker -- 0.50 packs/day for 20 years    Types: Cigarettes  . Smokeless tobacco: Never Used  . Alcohol Use: No    OB History   Grav Para Term Preterm Abortions TAB SAB Ect Mult Living   7 5 5  2  2   5       Review of Systems  Musculoskeletal: Positive for back pain.  All other systems reviewed and are negative.    Allergies  Bee venom; Ibuprofen; Tylenol; Augmentin; Elastic bandages &; Gold salts; Grapefruit concentrate; Latex; Nickel; Other; Penicillins; Sulfa antibiotics; and Tramadol  Home Medications   Current Outpatient Rx  Name  Route  Sig  Dispense  Refill  . albuterol (PROVENTIL HFA;VENTOLIN HFA) 108 (90 BASE) MCG/ACT inhaler   Inhalation   Inhale 2 puffs into the lungs every 6 (six) hours as needed for  wheezing. Patient needs rx for new inhaler.         . ALPRAZolam (XANAX) 1 MG tablet   Oral   Take 0.5 mg by mouth daily as needed for anxiety.         Marland Kitchen albuterol (PROVENTIL HFA;VENTOLIN HFA) 108 (90 BASE) MCG/ACT inhaler   Inhalation   Inhale 1-2 puffs into the lungs every 6 (six) hours as needed for wheezing.   1 Inhaler   0   . EPINEPHrine (EPIPEN) 0.3 mg/0.3 mL DEVI   Intramuscular   Inject 0.3 mLs (0.3 mg total) into the muscle as needed.   1 Device   0   . HYDROcodone-acetaminophen (NORCO/VICODIN) 5-325 MG per tablet   Oral   Take 1 tablet by mouth every 4 (four) hours as needed for pain.   10 tablet   0     BP 120/81  Pulse 100  Temp(Src) 98.1 F (36.7 C)  Resp 18  SpO2 100%  LMP 04/15/2012  Physical Exam  Nursing note and vitals reviewed. Constitutional: She appears well-developed and well-nourished. No distress.  HENT:  Head: Normocephalic and atraumatic.  Eyes: Pupils are equal, round, and reactive to light.  Neck: Normal range of motion. Neck supple.  Cardiovascular: Normal rate and regular rhythm.  Pulmonary/Chest: Effort normal.  Abdominal: Soft.  Musculoskeletal:       Back:   Equal strength to bilateral lower extremities. Neurosensory  function adequate to both legs. Skin color is normal. Skin is warm and moist. I see no step off deformity, no bony tenderness. Pt is able to ambulate without limp. Pain is relieved when sitting in certain positions. ROM is decreased due to pain. No crepitus, laceration, effusion, swelling.  Pulses are normal   Neurological: She is alert.  Skin: Skin is warm and dry.    ED Course  Procedures (including critical care time)  Labs Reviewed - No data to display Dg Hip Bilateral W/pelvis  05/19/2012  *RADIOLOGY REPORT*  Clinical Data: Larey Seat.  Pain.  BILATERAL HIP WITH PELVIS - 4+ VIEW  Comparison: None.  Findings: Joint space heights are normal.  No osteophytes.  No evidence of avascular necrosis.  No regional  fracture.  Sacroiliac joints and symphysis pubis appear normal.  No sacral abnormalities seen.  IMPRESSION: Negative radiographs   Original Report Authenticated By: Paulina Fusi, M.D.      1. Fall against object, initial encounter   2. Contusion, hip and thigh, right, initial encounter       MDM  Patient with back pain. No neurological deficits. Patient is ambulatory. No warning symptoms of back pain including: loss of bowel or bladder control, night sweats, waking from sleep with back pain, unexplained fevers or weight loss, h/o cancer, IVDU, recent trauma. No concern for cauda equina, epidural abscess, or other serious cause of back pain. Conservative measures such as rest, ice/heat and pain medicine indicated with PCP follow-up if no improvement with conservative management.   Patient acting very dramatic in triage. Even though she is allergic to "tylenol" she wants Percocet. "Those little round with pills with the 5-? On it. Those ones work. That's what I always get" and "I need a muscle relaxer but robaxin and flexeril dont work. I need that one that starts with a V".  Pt given a few Vicodin. Albuterol refill and Epi Pen refills that she requested. Pt tearful that prescriptions arent what she wanted. Tries to fall out of stretcher.  Pt has been advised of the symptoms that warrant their return to the ED. Patient has voiced understanding and has agreed to follow-up with the PCP or specialist.         Dorthula Matas, PA-C 05/19/12 1336

## 2012-05-19 NOTE — ED Notes (Signed)
Pt c/o fall off ladder and pain in lower back with radiation down left leg starting yesterday

## 2012-05-21 NOTE — ED Provider Notes (Signed)
Medical screening examination/treatment/procedure(s) were performed by non-physician practitioner and as supervising physician I was immediately available for consultation/collaboration.  Shantai Tiedeman T Shelvie Salsberry, MD 05/21/12 0822 

## 2012-08-01 ENCOUNTER — Emergency Department (HOSPITAL_COMMUNITY): Payer: Self-pay

## 2012-08-01 ENCOUNTER — Emergency Department (HOSPITAL_COMMUNITY)
Admission: EM | Admit: 2012-08-01 | Discharge: 2012-08-01 | Disposition: A | Discharge status: Home / Self Care | Payer: Self-pay | Attending: Emergency Medicine | Admitting: Emergency Medicine

## 2012-08-01 ENCOUNTER — Encounter (HOSPITAL_COMMUNITY): Payer: Self-pay | Admitting: *Deleted

## 2012-08-01 DIAGNOSIS — Y9289 Other specified places as the place of occurrence of the external cause: Secondary | ICD-10-CM | POA: Insufficient documentation

## 2012-08-01 DIAGNOSIS — F17200 Nicotine dependence, unspecified, uncomplicated: Secondary | ICD-10-CM | POA: Insufficient documentation

## 2012-08-01 DIAGNOSIS — Z880 Allergy status to penicillin: Secondary | ICD-10-CM | POA: Insufficient documentation

## 2012-08-01 DIAGNOSIS — S63501A Unspecified sprain of right wrist, initial encounter: Secondary | ICD-10-CM

## 2012-08-01 DIAGNOSIS — Z8701 Personal history of pneumonia (recurrent): Secondary | ICD-10-CM | POA: Insufficient documentation

## 2012-08-01 DIAGNOSIS — IMO0002 Reserved for concepts with insufficient information to code with codable children: Secondary | ICD-10-CM | POA: Insufficient documentation

## 2012-08-01 DIAGNOSIS — S43401A Unspecified sprain of right shoulder joint, initial encounter: Secondary | ICD-10-CM

## 2012-08-01 DIAGNOSIS — Z8679 Personal history of other diseases of the circulatory system: Secondary | ICD-10-CM | POA: Insufficient documentation

## 2012-08-01 DIAGNOSIS — S46909A Unspecified injury of unspecified muscle, fascia and tendon at shoulder and upper arm level, unspecified arm, initial encounter: Secondary | ICD-10-CM | POA: Insufficient documentation

## 2012-08-01 DIAGNOSIS — S4980XA Other specified injuries of shoulder and upper arm, unspecified arm, initial encounter: Secondary | ICD-10-CM | POA: Insufficient documentation

## 2012-08-01 DIAGNOSIS — S63509A Unspecified sprain of unspecified wrist, initial encounter: Secondary | ICD-10-CM | POA: Insufficient documentation

## 2012-08-01 DIAGNOSIS — Z79899 Other long term (current) drug therapy: Secondary | ICD-10-CM | POA: Insufficient documentation

## 2012-08-01 DIAGNOSIS — Y9389 Activity, other specified: Secondary | ICD-10-CM | POA: Insufficient documentation

## 2012-08-01 DIAGNOSIS — J45909 Unspecified asthma, uncomplicated: Secondary | ICD-10-CM | POA: Insufficient documentation

## 2012-08-01 DIAGNOSIS — Z87448 Personal history of other diseases of urinary system: Secondary | ICD-10-CM | POA: Insufficient documentation

## 2012-08-01 MED ORDER — HYDROCODONE-ACETAMINOPHEN 5-325 MG PO TABS: ORAL_TABLET | ORAL | Status: DC

## 2012-08-01 NOTE — ED Provider Notes (Signed)
History    CSN: [id number] Arrival date & time 04-Aug-2012  1319  First MD Initiated Contact with Patient 08/04/2012 1336     Chief Complaint  Patient presents with  . Wrist Pain  . Shoulder Pain   (Consider location/radiation/quality/duration/timing/severity/associated sxs/prior Treatment) HPI Comments: Christine Vargas is a 37 y.o. female who presents to the Emergency Department complaining of right shoulder and right wrist pain that began on the day prior to ED arrival.  States that she slipped and fell on her shoulder and hand.  Has pain with attempting to move the wrist and lift the right arm.  She denies swelling, deformity, numbness or weakness of the extremity.  She also denies head injury, neck or back pain, or LOC.  Patient is a 37 y.o. female presenting with shoulder injury. The history is provided by the patient.  Shoulder Injury This is a new problem. The current episode started in the past 7 days. The problem occurs constantly. The problem has been unchanged. Associated symptoms include arthralgias. Pertinent negatives include no chest pain, chills, fever, headaches, joint swelling, nausea, neck pain, numbness, vertigo, vomiting or weakness. The symptoms are aggravated by bending and twisting (arm abduction). She has tried acetaminophen and ice for the symptoms. The treatment provided no relief.   Past Medical History  Diagnosis Date  . Migraines   . Asthma   . Renal disorder   . Pneumonia    Past Surgical History  Procedure Laterality Date  . Finger surgery      ring finger left hand  . Mouth surgery     No family history on file. History  Substance Use Topics  . Smoking status: Current Every Day Smoker -- 0.50 packs/day for 20 years    Types: Cigarettes  . Smokeless tobacco: Never Used  . Alcohol Use: No   OB History   Grav Para Term Preterm Abortions TAB SAB Ect Mult Living   7 5 5  2  2   5      Review of Systems  Constitutional: Negative for fever and  chills.  HENT: Negative for neck pain.   Respiratory: Negative for shortness of breath.   Cardiovascular: Negative for chest pain.  Gastrointestinal: Negative for nausea and vomiting.  Genitourinary: Negative for dysuria, hematuria, flank pain and difficulty urinating.  Musculoskeletal: Positive for arthralgias. Negative for joint swelling.  Skin: Negative for color change and wound.  Neurological: Negative for dizziness, vertigo, syncope, weakness, numbness and headaches.  All other systems reviewed and are negative.    Allergies  Bee venom; Ibuprofen; Tylenol; Augmentin; Elastic bandages &; Gold salts; Grapefruit concentrate; Latex; Nickel; Other; Penicillins; Sulfa antibiotics; and Tramadol  Home Medications   Current Outpatient Rx  Name  Route  Sig  Dispense  Refill  . albuterol (PROVENTIL HFA;VENTOLIN HFA) 108 (90 BASE) MCG/ACT inhaler   Inhalation   Inhale 1-2 puffs into the lungs every 6 (six) hours as needed for wheezing.   1 Inhaler   0   . ALPRAZolam (XANAX) 1 MG tablet   Oral   Take 0.5 mg by mouth daily as needed for anxiety.          BP 132/76  Pulse 66  Temp(Src) 97.9 F (36.6 C) (Oral)  Resp 20  SpO2 99%  LMP 07/18/2012 Physical Exam  Nursing note and vitals reviewed. Constitutional: She is oriented to person, place, and time. She appears well-developed and well-nourished. No distress.  HENT:  Head: Normocephalic and atraumatic.  Neck: Normal  range of motion. Neck supple. No thyromegaly present.  Cardiovascular: Normal rate, regular rhythm, normal heart sounds and intact distal pulses.   No murmur heard. Pulmonary/Chest: Effort normal and breath sounds normal. No respiratory distress. She exhibits no tenderness.  Musculoskeletal: She exhibits tenderness. She exhibits no edema.       Right shoulder: She exhibits tenderness. She exhibits no bony tenderness, no swelling, no effusion, no crepitus, no deformity, no laceration, normal pulse and normal  strength.       Right wrist: She exhibits tenderness. She exhibits normal range of motion, no swelling, no effusion, no crepitus, no deformity and no laceration.  ttp of the right shoulder.  Pain with abduction of the right arm and rotation of the shoulder.  Radial pulse is brisk, distal sensation intact, CR< 2 sec.  No abrasions, edema or deformity of the joint. Diffuse ttp of the right distal wrist.  No edema or bony deformity  Lymphadenopathy:    She has no cervical adenopathy.  Neurological: She is alert and oriented to person, place, and time. She exhibits normal muscle tone. Coordination normal.  Skin: Skin is warm and dry.    ED Course  Procedures (including critical care time) Labs Reviewed - No data to display Dg Shoulder Right  2012-08-18   *RADIOLOGY REPORT*  Clinical Data: Fall.  RIGHT SHOULDER - 2+ VIEW  Comparison: 02/29/2012.  Findings: No evidence of fractures or dislocations.  No bone, joint or soft tissue abnormalities.  Contiguous ribs are normal.  IMPRESSION: No acute findings   Original Report Authenticated By: Sander Radon, M.D.   Dg Wrist Complete Right  08-18-12   *RADIOLOGY REPORT*  Clinical Data: Fall.  RIGHT WRIST - COMPLETE 3+ VIEW  Comparison: None.  Findings: No fractures or dislocations.  No acute bone, joint or soft tissue abnormalities.  IMPRESSION: No acute skeletal trauma.  Normal right wrist.   Original Report Authenticated By: Sander Radon, M.D.     MDM     velcro wrist splint and sling applied. Remains NV intact, pain improved.   Agrees to f/u with her PMD or with Dr. Hilda Lias if the pain is not improving.  No bruising, edema or bony deformity. Compartments soft.  VSS.  Stable for d/c  Miangel Flom L. Lashaye Fisk, PA-C 08/03/12 1709
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
## 2012-08-03 DIAGNOSIS — 419620001 Death: Secondary | SNOMED CT | POA: Insufficient documentation

## 2012-08-03 DEATH — deceased

## 2012-08-06 NOTE — ED Provider Notes (Signed)
Medical screening examination/treatment/procedure(s) were performed by non-physician practitioner and as supervising physician I was immediately available for consultation/collaboration.   Benny Lennert, MD 08/06/12 1344

## 2012-08-14 ENCOUNTER — Encounter (HOSPITAL_COMMUNITY): Payer: Self-pay

## 2012-08-14 ENCOUNTER — Emergency Department (HOSPITAL_COMMUNITY)
Admission: EM | Admit: 2012-08-14 | Discharge: 2012-08-14 | Disposition: A | Discharge status: Home / Self Care | Payer: Self-pay | Attending: Emergency Medicine | Admitting: Emergency Medicine

## 2012-08-14 ENCOUNTER — Emergency Department (HOSPITAL_COMMUNITY): Payer: Self-pay

## 2012-08-14 DIAGNOSIS — S62502A Fracture of unspecified phalanx of left thumb, initial encounter for closed fracture: Secondary | ICD-10-CM

## 2012-08-14 DIAGNOSIS — F329 Major depressive disorder, single episode, unspecified: Secondary | ICD-10-CM | POA: Insufficient documentation

## 2012-08-14 DIAGNOSIS — Z87448 Personal history of other diseases of urinary system: Secondary | ICD-10-CM | POA: Insufficient documentation

## 2012-08-14 DIAGNOSIS — S6990XA Unspecified injury of unspecified wrist, hand and finger(s), initial encounter: Secondary | ICD-10-CM | POA: Insufficient documentation

## 2012-08-14 DIAGNOSIS — F3289 Other specified depressive episodes: Secondary | ICD-10-CM | POA: Insufficient documentation

## 2012-08-14 DIAGNOSIS — S298XXA Other specified injuries of thorax, initial encounter: Secondary | ICD-10-CM

## 2012-08-14 DIAGNOSIS — Y9389 Activity, other specified: Secondary | ICD-10-CM | POA: Insufficient documentation

## 2012-08-14 DIAGNOSIS — W2209XA Striking against other stationary object, initial encounter: Secondary | ICD-10-CM | POA: Insufficient documentation

## 2012-08-14 DIAGNOSIS — Z91040 Latex allergy status: Secondary | ICD-10-CM | POA: Insufficient documentation

## 2012-08-14 DIAGNOSIS — Y929 Unspecified place or not applicable: Secondary | ICD-10-CM | POA: Insufficient documentation

## 2012-08-14 DIAGNOSIS — F411 Generalized anxiety disorder: Secondary | ICD-10-CM | POA: Insufficient documentation

## 2012-08-14 DIAGNOSIS — IMO0002 Reserved for concepts with insufficient information to code with codable children: Secondary | ICD-10-CM | POA: Insufficient documentation

## 2012-08-14 DIAGNOSIS — Z8679 Personal history of other diseases of the circulatory system: Secondary | ICD-10-CM | POA: Insufficient documentation

## 2012-08-14 DIAGNOSIS — Z880 Allergy status to penicillin: Secondary | ICD-10-CM | POA: Insufficient documentation

## 2012-08-14 DIAGNOSIS — Z79899 Other long term (current) drug therapy: Secondary | ICD-10-CM | POA: Insufficient documentation

## 2012-08-14 DIAGNOSIS — S20219A Contusion of unspecified front wall of thorax, initial encounter: Secondary | ICD-10-CM | POA: Insufficient documentation

## 2012-08-14 DIAGNOSIS — J45901 Unspecified asthma with (acute) exacerbation: Secondary | ICD-10-CM | POA: Insufficient documentation

## 2012-08-14 DIAGNOSIS — S59909A Unspecified injury of unspecified elbow, initial encounter: Secondary | ICD-10-CM | POA: Insufficient documentation

## 2012-08-14 DIAGNOSIS — Z8701 Personal history of pneumonia (recurrent): Secondary | ICD-10-CM | POA: Insufficient documentation

## 2012-08-14 DIAGNOSIS — S0990XA Unspecified injury of head, initial encounter: Secondary | ICD-10-CM | POA: Insufficient documentation

## 2012-08-14 DIAGNOSIS — F17200 Nicotine dependence, unspecified, uncomplicated: Secondary | ICD-10-CM | POA: Insufficient documentation

## 2012-08-14 DIAGNOSIS — S20212A Contusion of left front wall of thorax, initial encounter: Secondary | ICD-10-CM

## 2012-08-14 MED ORDER — DIAZEPAM 5 MG PO TABS
5.0000 mg | ORAL_TABLET | Freq: Once | ORAL | Status: AC
  Administered 2012-08-14: 5 mg via ORAL
  Filled 2012-08-14: qty 1

## 2012-08-14 MED ORDER — BACLOFEN 10 MG PO TABS: 10.0000 mg | ORAL_TABLET | Freq: Three times a day (TID) | ORAL | Status: AC

## 2012-08-14 MED ORDER — ONDANSETRON HCL 4 MG PO TABS
4.0000 mg | ORAL_TABLET | Freq: Once | ORAL | Status: AC
  Administered 2012-08-14: 4 mg via ORAL
  Filled 2012-08-14: qty 1

## 2012-08-14 MED ORDER — HYDROCODONE-ACETAMINOPHEN 7.5-325 MG PO TABS: 1.0000 | ORAL_TABLET | ORAL | Status: DC | PRN

## 2012-08-14 MED ORDER — HYDROCODONE-ACETAMINOPHEN 5-325 MG PO TABS
2.0000 | ORAL_TABLET | Freq: Once | ORAL | Status: AC
  Administered 2012-08-14: 2 via ORAL
  Filled 2012-08-14: qty 2
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
## 2012-08-15 NOTE — ED Provider Notes (Signed)
Medical screening examination/treatment/procedure(s) were performed by non-physician practitioner and as supervising physician I was immediately available for consultation/collaboration.  Akram Kissick, MD 08/15/12 1947 

## 2012-08-23 ENCOUNTER — Emergency Department (HOSPITAL_COMMUNITY)
Admission: EM | Admit: 2012-08-23 | Discharge: 2012-08-23 | Disposition: A | Discharge status: Home / Self Care | Payer: Self-pay | Attending: Emergency Medicine | Admitting: Emergency Medicine

## 2012-08-23 ENCOUNTER — Emergency Department (HOSPITAL_COMMUNITY): Payer: Self-pay

## 2012-08-23 ENCOUNTER — Encounter (HOSPITAL_COMMUNITY): Payer: Self-pay

## 2012-08-23 DIAGNOSIS — Y929 Unspecified place or not applicable: Secondary | ICD-10-CM | POA: Insufficient documentation

## 2012-08-23 DIAGNOSIS — Z79899 Other long term (current) drug therapy: Secondary | ICD-10-CM | POA: Insufficient documentation

## 2012-08-23 DIAGNOSIS — Y9389 Activity, other specified: Secondary | ICD-10-CM | POA: Insufficient documentation

## 2012-08-23 DIAGNOSIS — Z8679 Personal history of other diseases of the circulatory system: Secondary | ICD-10-CM | POA: Insufficient documentation

## 2012-08-23 DIAGNOSIS — J45909 Unspecified asthma, uncomplicated: Secondary | ICD-10-CM | POA: Insufficient documentation

## 2012-08-23 DIAGNOSIS — S20212D Contusion of left front wall of thorax, subsequent encounter: Secondary | ICD-10-CM

## 2012-08-23 DIAGNOSIS — W010XXA Fall on same level from slipping, tripping and stumbling without subsequent striking against object, initial encounter: Secondary | ICD-10-CM | POA: Insufficient documentation

## 2012-08-23 DIAGNOSIS — F17200 Nicotine dependence, unspecified, uncomplicated: Secondary | ICD-10-CM | POA: Insufficient documentation

## 2012-08-23 DIAGNOSIS — Z91040 Latex allergy status: Secondary | ICD-10-CM | POA: Insufficient documentation

## 2012-08-23 DIAGNOSIS — S20219A Contusion of unspecified front wall of thorax, initial encounter: Secondary | ICD-10-CM | POA: Insufficient documentation

## 2012-08-23 DIAGNOSIS — Z880 Allergy status to penicillin: Secondary | ICD-10-CM | POA: Insufficient documentation

## 2012-08-23 DIAGNOSIS — Z8701 Personal history of pneumonia (recurrent): Secondary | ICD-10-CM | POA: Insufficient documentation

## 2012-08-23 DIAGNOSIS — S62502D Fracture of unspecified phalanx of left thumb, subsequent encounter for fracture with routine healing: Secondary | ICD-10-CM

## 2012-08-23 DIAGNOSIS — S62609A Fracture of unspecified phalanx of unspecified finger, initial encounter for closed fracture: Secondary | ICD-10-CM | POA: Insufficient documentation

## 2012-08-23 DIAGNOSIS — Z87448 Personal history of other diseases of urinary system: Secondary | ICD-10-CM | POA: Insufficient documentation

## 2012-08-23 MED ORDER — HYDROCODONE-ACETAMINOPHEN 5-325 MG PO TABS: 1.0000 | ORAL_TABLET | Freq: Once | ORAL | Status: DC

## 2012-08-23 MED ORDER — HYDROCODONE-ACETAMINOPHEN 5-325 MG PO TABS
1.0000 | ORAL_TABLET | Freq: Once | ORAL | Status: AC
  Administered 2012-08-23: 1 via ORAL
  Filled 2012-08-23: qty 1

## 2012-08-23 MED ORDER — HYDROCODONE-ACETAMINOPHEN 7.5-325 MG PO TABS: 1.0000 | ORAL_TABLET | Freq: Four times a day (QID) | ORAL | Status: DC | PRN
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
## 2012-08-23 NOTE — ED Notes (Signed)
No answer when called 

## 2012-08-23 NOTE — ED Notes (Signed)
Lt thumb placed in splint.
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
## 2012-08-25 NOTE — ED Provider Notes (Signed)
Medical screening examination/treatment/procedure(s) were performed by non-physician practitioner and as supervising physician I was immediately available for consultation/collaboration.  Lyanne Co, MD 08/25/12 1101

## 2012-09-15 ENCOUNTER — Encounter (HOSPITAL_COMMUNITY): Payer: Self-pay | Admitting: Emergency Medicine

## 2012-09-15 ENCOUNTER — Emergency Department (HOSPITAL_COMMUNITY)
Admission: EM | Admit: 2012-09-15 | Discharge: 2012-09-15 | Disposition: A | Discharge status: Home / Self Care | Payer: Self-pay | Attending: Emergency Medicine | Admitting: Emergency Medicine

## 2012-09-15 DIAGNOSIS — W2203XA Walked into furniture, initial encounter: Secondary | ICD-10-CM | POA: Insufficient documentation

## 2012-09-15 DIAGNOSIS — S62609A Fracture of unspecified phalanx of unspecified finger, initial encounter for closed fracture: Secondary | ICD-10-CM | POA: Insufficient documentation

## 2012-09-15 DIAGNOSIS — S60012A Contusion of left thumb without damage to nail, initial encounter: Secondary | ICD-10-CM

## 2012-09-15 DIAGNOSIS — Y92009 Unspecified place in unspecified non-institutional (private) residence as the place of occurrence of the external cause: Secondary | ICD-10-CM | POA: Insufficient documentation

## 2012-09-15 DIAGNOSIS — S0990XA Unspecified injury of head, initial encounter: Secondary | ICD-10-CM | POA: Insufficient documentation

## 2012-09-15 DIAGNOSIS — Z9889 Other specified postprocedural states: Secondary | ICD-10-CM | POA: Insufficient documentation

## 2012-09-15 DIAGNOSIS — S62502A Fracture of unspecified phalanx of left thumb, initial encounter for closed fracture: Secondary | ICD-10-CM

## 2012-09-15 DIAGNOSIS — Z87448 Personal history of other diseases of urinary system: Secondary | ICD-10-CM | POA: Insufficient documentation

## 2012-09-15 DIAGNOSIS — Z8701 Personal history of pneumonia (recurrent): Secondary | ICD-10-CM | POA: Insufficient documentation

## 2012-09-15 DIAGNOSIS — S6000XA Contusion of unspecified finger without damage to nail, initial encounter: Secondary | ICD-10-CM | POA: Insufficient documentation

## 2012-09-15 DIAGNOSIS — F17200 Nicotine dependence, unspecified, uncomplicated: Secondary | ICD-10-CM | POA: Insufficient documentation

## 2012-09-15 DIAGNOSIS — Z880 Allergy status to penicillin: Secondary | ICD-10-CM | POA: Insufficient documentation

## 2012-09-15 DIAGNOSIS — Z91040 Latex allergy status: Secondary | ICD-10-CM | POA: Insufficient documentation

## 2012-09-15 DIAGNOSIS — Z8679 Personal history of other diseases of the circulatory system: Secondary | ICD-10-CM | POA: Insufficient documentation

## 2012-09-15 DIAGNOSIS — J45909 Unspecified asthma, uncomplicated: Secondary | ICD-10-CM | POA: Insufficient documentation

## 2012-09-15 DIAGNOSIS — Z79899 Other long term (current) drug therapy: Secondary | ICD-10-CM | POA: Insufficient documentation

## 2012-09-15 DIAGNOSIS — Y9389 Activity, other specified: Secondary | ICD-10-CM | POA: Insufficient documentation

## 2012-09-15 MED ORDER — HYDROCODONE-ACETAMINOPHEN 7.5-325 MG PO TABS: 1.0000 | ORAL_TABLET | ORAL | Status: DC | PRN

## 2012-09-15 MED ORDER — HYDROCODONE-ACETAMINOPHEN 5-325 MG PO TABS
ORAL_TABLET | ORAL | Status: AC
  Filled 2012-09-15: qty 2

## 2012-09-15 MED ORDER — HYDROCODONE-ACETAMINOPHEN 5-325 MG PO TABS
2.0000 | ORAL_TABLET | Freq: Once | ORAL | Status: AC
  Administered 2012-09-15: 2 via ORAL

## 2012-09-15 MED ORDER — ONDANSETRON HCL 4 MG PO TABS
ORAL_TABLET | ORAL | Status: AC
  Filled 2012-09-15: qty 1

## 2012-09-15 MED ORDER — ONDANSETRON HCL 4 MG PO TABS
4.0000 mg | ORAL_TABLET | Freq: Once | ORAL | Status: AC
  Administered 2012-09-15: 4 mg via ORAL
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
## 2012-09-15 NOTE — ED Provider Notes (Signed)
Medical screening examination/treatment/procedure(s) were performed by non-physician practitioner and as supervising physician I was immediately available for consultation/collaboration. Devoria Albe, MD, Armando Gang   Ward Givens, MD 09/15/12 [phone]

## 2012-09-15 NOTE — ED Notes (Signed)
New ace wrap applied over glove at pt request.

## 2012-09-15 NOTE — Discharge Instructions (Signed)
Your exam is consistent with a bruise or contusion to the previously documented fracture of your left  Thumb. It is very important that you see Dr Hilda Lias as soon as possible for evaluation and management of the fracture and the hand pain. Please keep the left hand elevated. Apply ice. Use norco for pain if needed.
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
## 2012-12-22 ENCOUNTER — Encounter (HOSPITAL_COMMUNITY): Payer: Self-pay | Admitting: Emergency Medicine

## 2012-12-22 ENCOUNTER — Emergency Department (HOSPITAL_COMMUNITY)
Admission: EM | Admit: 2012-12-22 | Discharge: 2012-12-22 | Disposition: A | Discharge status: Home / Self Care | Payer: Medicaid Other | Attending: Emergency Medicine | Admitting: Emergency Medicine

## 2012-12-22 DIAGNOSIS — S8990XA Unspecified injury of unspecified lower leg, initial encounter: Secondary | ICD-10-CM | POA: Insufficient documentation

## 2012-12-22 DIAGNOSIS — Y9389 Activity, other specified: Secondary | ICD-10-CM | POA: Insufficient documentation

## 2012-12-22 DIAGNOSIS — F17200 Nicotine dependence, unspecified, uncomplicated: Secondary | ICD-10-CM | POA: Insufficient documentation

## 2012-12-22 DIAGNOSIS — S335XXA Sprain of ligaments of lumbar spine, initial encounter: Secondary | ICD-10-CM | POA: Insufficient documentation

## 2012-12-22 DIAGNOSIS — Z79899 Other long term (current) drug therapy: Secondary | ICD-10-CM | POA: Insufficient documentation

## 2012-12-22 DIAGNOSIS — Z91040 Latex allergy status: Secondary | ICD-10-CM | POA: Insufficient documentation

## 2012-12-22 DIAGNOSIS — S39012A Strain of muscle, fascia and tendon of lower back, initial encounter: Secondary | ICD-10-CM

## 2012-12-22 DIAGNOSIS — Z880 Allergy status to penicillin: Secondary | ICD-10-CM | POA: Insufficient documentation

## 2012-12-22 DIAGNOSIS — Z87448 Personal history of other diseases of urinary system: Secondary | ICD-10-CM | POA: Insufficient documentation

## 2012-12-22 DIAGNOSIS — Z8701 Personal history of pneumonia (recurrent): Secondary | ICD-10-CM | POA: Insufficient documentation

## 2012-12-22 DIAGNOSIS — X500XXA Overexertion from strenuous movement or load, initial encounter: Secondary | ICD-10-CM | POA: Insufficient documentation

## 2012-12-22 DIAGNOSIS — J45909 Unspecified asthma, uncomplicated: Secondary | ICD-10-CM | POA: Insufficient documentation

## 2012-12-22 DIAGNOSIS — Y92009 Unspecified place in unspecified non-institutional (private) residence as the place of occurrence of the external cause: Secondary | ICD-10-CM | POA: Insufficient documentation

## 2012-12-22 DIAGNOSIS — Z8679 Personal history of other diseases of the circulatory system: Secondary | ICD-10-CM | POA: Insufficient documentation

## 2012-12-22 MED ORDER — CYCLOBENZAPRINE HCL 10 MG PO TABS
10.0000 mg | ORAL_TABLET | Freq: Once | ORAL | Status: AC
  Administered 2012-12-22: 10 mg via ORAL
  Filled 2012-12-22: qty 1

## 2012-12-22 MED ORDER — OXYCODONE HCL 5 MG PO TABS
5.0000 mg | ORAL_TABLET | Freq: Once | ORAL | Status: AC
  Administered 2012-12-22: 5 mg via ORAL
  Filled 2012-12-22: qty 1

## 2012-12-22 MED ORDER — CYCLOBENZAPRINE HCL 10 MG PO TABS: 10.0000 mg | ORAL_TABLET | Freq: Two times a day (BID) | ORAL | Status: DC | PRN

## 2012-12-22 NOTE — ED Provider Notes (Signed)
CSN: [id number]     Arrival date & time 12/22/12  1318 History   First MD Initiated Contact with Patient 12/22/12 1344     Chief Complaint  Patient presents with  . Back Pain   (Consider location/radiation/quality/duration/timing/severity/associated sxs/prior Treatment) Patient is a 37 y.o. female presenting with back pain. The history is provided by the patient.  Back Pain Location:  Lumbar spine Quality:  Aching Radiates to:  Does not radiate Pain severity:  Moderate Pain is:  Same all the time Onset quality:  Sudden Duration:  12 hours Timing:  Constant Progression:  Unchanged Chronicity:  Chronic Relieved by:  Nothing Worsened by:  Movement, standing, ambulation, bending and palpation Ineffective treatments:  Heating pad Associated symptoms: leg pain   Associated symptoms: no abdominal pain, no bladder incontinence, no bowel incontinence, no dysuria, no fever, no tingling and no weakness    Kelsi Benham Wey is a 37 y.o. female who presents to the ED with low back pain. She was bending down to get the cat some milk out of the refrigerator and felt a sudden pain in her lower back that radiated to her left leg. That happened last night but she didn't come in then because all her children were there. She waited to come in today while they were in school but states she needs to leave as soon as possible to pick them up.    Past Medical History  Diagnosis Date  . Migraines   . Asthma   . Renal disorder   . Pneumonia    Past Surgical History  Procedure Laterality Date  . Finger surgery      ring finger left hand  . Mouth surgery     No family history on file. History  Substance Use Topics  . Smoking status: Current Every Day Smoker -- 0.50 packs/day for 20 years    Types: Cigarettes  . Smokeless tobacco: Never Used  . Alcohol Use: No   OB History   Grav Para Term Preterm Abortions TAB SAB Ect Mult Living   7 5 5  2  2   5      Review of Systems    Constitutional: Negative for fever and chills.  HENT: Negative.   Respiratory: Negative for cough.   Gastrointestinal: Negative for nausea, vomiting, abdominal pain and bowel incontinence.  Genitourinary: Negative for bladder incontinence, dysuria and urgency.  Musculoskeletal: Positive for back pain.  Skin: Negative for wound.  Neurological: Negative for tingling and weakness.  Psychiatric/Behavioral: The patient is not nervous/anxious.     Allergies  Bee venom; Ibuprofen; Tylenol; Augmentin; Elastic bandages &; Gold-containing drug products; Grapefruit concentrate; Latex; Nickel; Other; Penicillins; Sulfa antibiotics; and Tramadol  Home Medications   Current Outpatient Rx  Name  Route  Sig  Dispense  Refill  . albuterol (PROVENTIL HFA;VENTOLIN HFA) 108 (90 BASE) MCG/ACT inhaler   Inhalation   Inhale 1-2 puffs into the lungs every 6 (six) hours as needed for wheezing.   1 Inhaler   0   . ALPRAZolam (XANAX) 1 MG tablet   Oral   Take 0.5 mg by mouth daily as needed for anxiety.         Marland Kitchen HYDROcodone-acetaminophen (NORCO) 7.5-325 MG per tablet   Oral   Take 1 tablet by mouth every 4 (four) hours as needed for pain.   20 tablet   0   . HYDROcodone-acetaminophen (NORCO) 7.5-325 MG per tablet   Oral   Take 1 tablet by mouth every 6 (  six) hours as needed for pain.   20 tablet   0   . HYDROcodone-acetaminophen (NORCO) 7.5-325 MG per tablet   Oral   Take 1 tablet by mouth every 4 (four) hours as needed for pain.   15 tablet   0    BP 115/64  Pulse 88  Temp(Src) 98.3 F (36.8 C) (Oral)  Resp 20  Ht 5\' 2"  (1.575 m)  Wt 112 lb (50.803 kg)  BMI 20.48 kg/m2  SpO2 99%  LMP 12/08/2012 Physical Exam  Nursing note and vitals reviewed. Constitutional: She is oriented to person, place, and time. She appears well-developed and well-nourished. No distress.  HENT:  Head: Normocephalic and atraumatic.  Eyes: Conjunctivae and EOM are normal.  Neck: Neck supple.   Cardiovascular: Normal rate, regular rhythm and normal heart sounds.   Pulmonary/Chest: Effort normal and breath sounds normal.  Abdominal: Soft. There is no tenderness.  Musculoskeletal:       Lumbar back: She exhibits decreased range of motion (due to pain) and spasm. She exhibits normal pulse.       Back:  Pedal pulses equal, adequate circulation, good touch sensation.  Neurological: She is alert and oriented to person, place, and time. She has normal strength and normal reflexes. No cranial nerve deficit or sensory deficit. Gait normal.  Skin: Skin is warm and dry.  Psychiatric: She has a normal mood and affect. Her behavior is normal.    ED Course  Procedures  Patient states she is driving but can get a family member to pick her up if we give her pain medication.  Given Flexeril 10 mg. And hydrocodone 5 mg here in the ED. States her pain is now down to a 2/10.  EKG Interpretation   None       MDM  37 y.o. female with low back pain after bending down to get something out of the refrigerator. Much improved with muscle relaxant and pain medication. She has liver problems and is not to take tylenol or ibuprofen. She is allergic to Tramadol. Will give Flexeril and she is to follow up with her doctor. Discussed with the patient and all questioned fully answered. She is upset due to the fact that we are not giving a narcotic pain medication. States she will go to [place] to get some.    Medication List    TAKE these medications       cyclobenzaprine 10 MG tablet  Commonly known as:  FLEXERIL  Take 1 tablet (10 mg total) by mouth 2 (two) times daily as needed for muscle spasms.      ASK your doctor about these medications       albuterol 108 (90 BASE) MCG/ACT inhaler  Commonly known as:  PROVENTIL HFA;VENTOLIN HFA  Inhale 1-2 puffs into the lungs every 6 (six) hours as needed for wheezing.           Janne Napoleon, NP 12/22/12 [phone]

## 2012-12-22 NOTE — ED Provider Notes (Signed)
Medical screening examination/treatment/procedure(s) were performed by non-physician practitioner and as supervising physician I was immediately available for consultation/collaboration.  EKG Interpretation   None        Chidiebere Wynn, MD 12/22/12 1609 

## 2012-12-22 NOTE — ED Notes (Signed)
Pt c/o lower back pain after bending down to get something out of the refrigerator.

## 2013-02-13 ENCOUNTER — Encounter (HOSPITAL_COMMUNITY): Payer: Self-pay | Admitting: Emergency Medicine

## 2013-02-13 ENCOUNTER — Emergency Department (HOSPITAL_COMMUNITY)
Admission: EM | Admit: 2013-02-13 | Discharge: 2013-02-13 | Disposition: A | Discharge status: Home / Self Care | Payer: Medicaid Other | Attending: Emergency Medicine | Admitting: Emergency Medicine

## 2013-02-13 DIAGNOSIS — Z79899 Other long term (current) drug therapy: Secondary | ICD-10-CM | POA: Insufficient documentation

## 2013-02-13 DIAGNOSIS — F17200 Nicotine dependence, unspecified, uncomplicated: Secondary | ICD-10-CM | POA: Insufficient documentation

## 2013-02-13 DIAGNOSIS — Z91040 Latex allergy status: Secondary | ICD-10-CM | POA: Insufficient documentation

## 2013-02-13 DIAGNOSIS — J45909 Unspecified asthma, uncomplicated: Secondary | ICD-10-CM | POA: Insufficient documentation

## 2013-02-13 DIAGNOSIS — Z8701 Personal history of pneumonia (recurrent): Secondary | ICD-10-CM | POA: Insufficient documentation

## 2013-02-13 DIAGNOSIS — N72 Inflammatory disease of cervix uteri: Secondary | ICD-10-CM | POA: Insufficient documentation

## 2013-02-13 DIAGNOSIS — Z8679 Personal history of other diseases of the circulatory system: Secondary | ICD-10-CM | POA: Insufficient documentation

## 2013-02-13 DIAGNOSIS — Z87448 Personal history of other diseases of urinary system: Secondary | ICD-10-CM | POA: Insufficient documentation

## 2013-02-13 DIAGNOSIS — Z3202 Encounter for pregnancy test, result negative: Secondary | ICD-10-CM | POA: Insufficient documentation

## 2013-02-13 LAB — URINALYSIS, ROUTINE W REFLEX MICROSCOPIC
Bilirubin Urine: NEGATIVE
Glucose, UA: NEGATIVE mg/dL
Hgb urine dipstick: NEGATIVE
Ketones, ur: NEGATIVE mg/dL
Leukocytes, UA: NEGATIVE
Nitrite: NEGATIVE
Protein, ur: NEGATIVE mg/dL
Specific Gravity, Urine: 1.01 (ref 1.005–1.030)
Urobilinogen, UA: 0.2 mg/dL (ref 0.0–1.0)
pH: 6.5 (ref 5.0–8.0)

## 2013-02-13 LAB — WET PREP, GENITAL
Clue Cells Wet Prep HPF POC: NONE SEEN
Trich, Wet Prep: NONE SEEN
Yeast Wet Prep HPF POC: NONE SEEN

## 2013-02-13 LAB — PREGNANCY, URINE: Preg Test, Ur: NEGATIVE

## 2013-02-13 MED ORDER — CEFTRIAXONE SODIUM 250 MG IJ SOLR
250.0000 mg | Freq: Once | INTRAMUSCULAR | Status: AC
  Administered 2013-02-13: 250 mg via INTRAMUSCULAR
  Filled 2013-02-13: qty 250

## 2013-02-13 MED ORDER — LIDOCAINE HCL (PF) 1 % IJ SOLN
INTRAMUSCULAR | Status: AC
  Administered 2013-02-13: 0.9 mL via INTRAMUSCULAR
  Filled 2013-02-13: qty 5

## 2013-02-13 MED ORDER — METRONIDAZOLE 500 MG PO TABS: 500.0000 mg | ORAL_TABLET | Freq: Two times a day (BID) | ORAL | Status: DC

## 2013-02-13 MED ORDER — AZITHROMYCIN 250 MG PO TABS
1000.0000 mg | ORAL_TABLET | Freq: Once | ORAL | Status: AC
  Administered 2013-02-13: 1000 mg via ORAL
  Filled 2013-02-13: qty 4

## 2013-02-13 NOTE — ED Provider Notes (Signed)
CSN: [id number]     Arrival date & time 02/13/13  1358 History  This chart was scribed for Donnetta HutchingBrian Kewanna Kasprzak, MD by Lindajo Royallujie Ifegwu, ED Scribe. This patient was seen in room APA10/APA10 and the patient's care was started at 2:23 PM.   Chief Complaint  Patient presents with  . Abdominal Pain    The history is provided by the patient. No language interpreter was used.    HPI Comments: Christine Vargas is a 38 y.o. female who presents to the Emergency Department complaining of one week of moderate suprapubic abdominal pain with associated foul-smelling vaginal discharge.  Pt describes pain as "almost like my uterus hurts."  She states she has had similar symptoms before in association with bacterial vaginosis in 2010.  She denies new urinary symptoms.  LNMP was 01/07/13.  Pt is [id number].  Severity is mild. No radiation of pain.   Past Medical History  Diagnosis Date  . Migraines   . Asthma   . Renal disorder   . Pneumonia     Past Surgical History  Procedure Laterality Date  . Finger surgery      ring finger left hand  . Mouth surgery      History reviewed. No pertinent family history.   History  Substance Use Topics  . Smoking status: Current Every Day Smoker -- 0.50 packs/day for 20 years    Types: Cigarettes  . Smokeless tobacco: Never Used  . Alcohol Use: No    OB History   Grav Para Term Preterm Abortions TAB SAB Ect Mult Living   7 5 5  2  2   5       Review of Systems A complete 10 system review of systems was obtained and all systems are negative except as noted in the HPI and PMH.    Allergies  Bee venom; Ibuprofen; Tylenol; Augmentin; Elastic bandages &; Gold-containing drug products; Grapefruit concentrate; Latex; Nickel; Other; Sulfa antibiotics; and Tramadol  Home Medications   Current Outpatient Rx  Name  Route  Sig  Dispense  Refill  . albuterol (PROVENTIL HFA;VENTOLIN HFA) 108 (90 BASE) MCG/ACT inhaler   Inhalation   Inhale 1-2 puffs into the lungs  every 6 (six) hours as needed for wheezing.   1 Inhaler   0   . metroNIDAZOLE (FLAGYL) 500 MG tablet   Oral   Take 1 tablet (500 mg total) by mouth 2 (two) times daily. One po bid x 7 days   14 tablet   0    BP 127/67  Pulse 64  Temp(Src) 98 F (36.7 C) (Oral)  Resp 18  Wt 115 lb (52.164 kg)  SpO2 98%  LMP 01/07/2013  Physical Exam  Nursing note and vitals reviewed. Constitutional: She is oriented to person, place, and time. She appears well-developed and well-nourished.  HENT:  Head: Normocephalic and atraumatic.  Eyes: Conjunctivae and EOM are normal. Pupils are equal, round, and reactive to light.  Neck: Normal range of motion. Neck supple.  Cardiovascular: Normal rate, regular rhythm and normal heart sounds.   Pulmonary/Chest: Effort normal and breath sounds normal.  Abdominal: Soft. Bowel sounds are normal. There is tenderness (minimal suprapubic tendenress).  Genitourinary:  Normal external genitalia.   Small amount of white cervical discharge. Minimal cervical motion tenderness. Slight right adnexal tenderness.  Musculoskeletal: Normal range of motion.  Neurological: She is alert and oriented to person, place, and time.  Skin: Skin is warm and dry.  Psychiatric: She has a normal mood and  affect. Her behavior is normal.    ED Course  Procedures (including critical care time)  DIAGNOSTIC STUDIES: Oxygen Saturation is 98% on room air, normal by my interpretation.    COORDINATION OF CARE: 2:30 PM-Discussed treatment plan which includes pelvic exam with pt at bedside and pt agreed to plan.     Labs Review Labs Reviewed  WET PREP, GENITAL - Abnormal; Notable for the following:    WBC, Wet Prep HPF POC FEW (*)    All other components within normal limits  GC/CHLAMYDIA PROBE AMP  URINALYSIS, ROUTINE W REFLEX MICROSCOPIC  PREGNANCY, URINE    Imaging Review No results found.  EKG Interpretation   None       MDM   1. Cervicitis    Patient has  apparently had a history of bacterial vaginosis.  No clinical evidence of PID. Will Rx Rocephin 250 mg IM,  Zithromax 1 g by mouth, Flagyl 500 mg twice a day for one week. Findings discussed with patient    I personally performed the services described in this documentation, which was scribed in my presence. The recorded information has been reviewed and is accurate.    Donnetta Hutching, MD 02/13/13 [phone]

## 2013-02-13 NOTE — Discharge Instructions (Signed)
Cervicitis Cervicitis is a soreness and puffiness (inflammation) of the cervix.  HOME CARE  Do not have sex (intercourse) until your doctor says it is okay.  Do not have sex until your partner is treated or as told by your doctor.  Take your antibiotic medicine as told. Finish it even if you start to feel better. GET HELP IF:   Yours symptoms that brought you to the doctor come back.  You have a fever. MAKE SURE YOU:   Understand these instructions.  Will watch your condition.  Will get help right away if you are not doing well or get worse. Document Released: 10/30/2007 Document Revised: 09/22/2012 Document Reviewed: 07/14/2012 [facility] Patient Information 2014 [place], [place].   Take antibiotic for one week. Significant other should be checked. [facility]

## 2013-02-13 NOTE — ED Notes (Signed)
Lower abdominal pain and foul odor after having sex.  Concerned she may have some vaginal bacteria.

## 2013-02-14 LAB — GC/CHLAMYDIA PROBE AMP
CT Probe RNA: NEGATIVE
GC Probe RNA: NEGATIVE

## 2013-03-10 ENCOUNTER — Encounter (HOSPITAL_COMMUNITY): Payer: Self-pay | Admitting: Emergency Medicine

## 2013-03-10 ENCOUNTER — Emergency Department (HOSPITAL_COMMUNITY): Payer: Medicaid Other

## 2013-03-10 ENCOUNTER — Emergency Department (HOSPITAL_COMMUNITY)
Admission: EM | Admit: 2013-03-10 | Discharge: 2013-03-10 | Disposition: A | Discharge status: Home / Self Care | Payer: Medicaid Other | Attending: Emergency Medicine | Admitting: Emergency Medicine

## 2013-03-10 DIAGNOSIS — Z79899 Other long term (current) drug therapy: Secondary | ICD-10-CM | POA: Insufficient documentation

## 2013-03-10 DIAGNOSIS — Z87448 Personal history of other diseases of urinary system: Secondary | ICD-10-CM | POA: Insufficient documentation

## 2013-03-10 DIAGNOSIS — J45909 Unspecified asthma, uncomplicated: Secondary | ICD-10-CM | POA: Insufficient documentation

## 2013-03-10 DIAGNOSIS — M25511 Pain in right shoulder: Secondary | ICD-10-CM

## 2013-03-10 DIAGNOSIS — R209 Unspecified disturbances of skin sensation: Secondary | ICD-10-CM | POA: Insufficient documentation

## 2013-03-10 DIAGNOSIS — M25519 Pain in unspecified shoulder: Secondary | ICD-10-CM | POA: Insufficient documentation

## 2013-03-10 DIAGNOSIS — Z91040 Latex allergy status: Secondary | ICD-10-CM | POA: Insufficient documentation

## 2013-03-10 DIAGNOSIS — F17200 Nicotine dependence, unspecified, uncomplicated: Secondary | ICD-10-CM | POA: Insufficient documentation

## 2013-03-10 DIAGNOSIS — Z8701 Personal history of pneumonia (recurrent): Secondary | ICD-10-CM | POA: Insufficient documentation

## 2013-03-10 DIAGNOSIS — Z8669 Personal history of other diseases of the nervous system and sense organs: Secondary | ICD-10-CM | POA: Insufficient documentation

## 2013-03-10 NOTE — ED Notes (Signed)
Pt angry, cursing and screaming. Left dept without signing out. Shouting in waiting area.

## 2013-03-10 NOTE — ED Provider Notes (Signed)
Medical screening examination/treatment/procedure(s) were performed by non-physician practitioner and as supervising physician I was immediately available for consultation/collaboration.  EKG Interpretation   None         Takeria Marquina W Tranesha Lessner, MD 03/10/13 1646 

## 2013-03-10 NOTE — ED Notes (Signed)
Pain to right shoulder since raking leaves last week. States pain keeps getting worse.

## 2013-03-10 NOTE — ED Provider Notes (Signed)
CSN: [id number]     Arrival date & time 03/10/13  0924 History   First MD Initiated Contact with Patient 03/10/13 1000     Chief Complaint  Patient presents with  . Shoulder Injury   (Consider location/radiation/quality/duration/timing/severity/associated sxs/prior Treatment) HPI Comments: Patient is a 38 year old female who presents to the emergency department complaining of right neck and shoulder pain x5 days. Patient states she was raking leaves and lifting bags of believes onto a pickup truck when the pain began, worsening over the past few days. Pain sharp, 10/10, radiates down her arm with occasional numbness. She has tried using heating pads without relief.  Patient is a 38 y.o. female presenting with shoulder injury. The history is provided by the patient.  Shoulder Injury Associated symptoms include numbness.    Past Medical History  Diagnosis Date  . Migraines   . Asthma   . Renal disorder   . Pneumonia    Past Surgical History  Procedure Laterality Date  . Finger surgery      ring finger left hand  . Mouth surgery     No family history on file. History  Substance Use Topics  . Smoking status: Current Every Day Smoker -- 0.50 packs/day for 20 years    Types: Cigarettes  . Smokeless tobacco: Never Used  . Alcohol Use: No   OB History   Grav Para Term Preterm Abortions TAB SAB Ect Mult Living   7 5 5  2  2   5      Review of Systems  Constitutional: Negative.   HENT: Negative.   Musculoskeletal:       Positive for neck and right shoulder pain.  Skin: Negative.   Neurological: Positive for numbness.    Allergies  Bee venom; Ibuprofen; Tylenol; Augmentin; Elastic bandages &; Gold-containing drug products; Grapefruit concentrate; Latex; Nickel; Other; Sulfa antibiotics; and Tramadol  Home Medications   Current Outpatient Rx  Name  Route  Sig  Dispense  Refill  . albuterol (PROVENTIL HFA;VENTOLIN HFA) 108 (90 BASE) MCG/ACT inhaler   Inhalation   Inhale  1-2 puffs into the lungs every 6 (six) hours as needed for wheezing.   1 Inhaler   0   . metroNIDAZOLE (FLAGYL) 500 MG tablet   Oral   Take 1 tablet (500 mg total) by mouth 2 (two) times daily. One po bid x 7 days   14 tablet   0    BP 127/91  Pulse 79  Temp(Src) 98.2 F (36.8 C) (Oral)  Resp 16  Ht 5\' 2"  (1.575 m)  Wt 115 lb (52.164 kg)  BMI 21.03 kg/m2  SpO2 99%  LMP 02/03/2013 Physical Exam  Nursing note and vitals reviewed. Constitutional: She is oriented to person, place, and time. She appears well-developed and well-nourished. No distress.  HENT:  Head: Normocephalic and atraumatic.  Mouth/Throat: Oropharynx is clear and moist.  Eyes: Conjunctivae are normal.  Neck: Normal range of motion. Neck supple. Spinous process tenderness and muscular tenderness present. No edema and normal range of motion present.  Cardiovascular: Normal rate, regular rhythm, normal heart sounds and intact distal pulses.   Pulmonary/Chest: Effort normal and breath sounds normal.  Musculoskeletal: Normal range of motion. She exhibits no edema.  TTP across neck, no specific point tenderness. Full ROM. TTP of entire right shoulder girdle, trapezius and deltoid, no spasm. Full ROM right shoulder. No deformity.  Neurological: She is alert and oriented to person, place, and time. She has normal strength. No sensory deficit.  Strength UE 5/5 and equal BL.  Skin: Skin is warm and dry. She is not diaphoretic.  Psychiatric: She has a normal mood and affect. Her behavior is normal.    ED Course  Procedures (including critical care time) Labs Review Labs Reviewed - No data to display Imaging Review Dg Cervical Spine Complete  03/10/2013   CLINICAL DATA:  Neck pain extending into the right shoulder.  EXAM: CERVICAL SPINE  4+ VIEWS  COMPARISON:  MRI of the cervical spine 04/09/2009.  FINDINGS: Five views of the cervical spine demonstrate no definite acute displaced cervical spine fracture. Alignment is  anatomic. Prevertebral soft tissues are normal. Mild multilevel degenerative disc disease, most pronounced at C6-C7. Mild multilevel facet arthropathy.  IMPRESSION: 1. No acute radiographic abnormality of the cervical spine. 2. Mild multilevel degenerative disc disease and cervical spondylosis, as above.   Electronically Signed   By: Trudie Reed M.D.   On: 03/10/2013 11:37   Dg Shoulder Right  03/10/2013   CLINICAL DATA:  Right shoulder pain following injury  EXAM: RIGHT SHOULDER - 2+ VIEW  COMPARISON:  None.  FINDINGS: There is no evidence of fracture or dislocation. There is no evidence of arthropathy or other focal bone abnormality. Soft tissues are unremarkable.  IMPRESSION: No acute abnormality noted.   Electronically Signed   By: Alcide Clever M.D.   On: 03/10/2013 11:48    EKG Interpretation   None       MDM   1. Shoulder pain, right    Patient presents for right shoulder pain. Well-appearing, normal vital signs, no apparent distress. Generalized tenderness of neck and shoulder. X-rays without any acute findings. On discussion of x-ray findings, patient becomes angry, begins screaming that she wants an MRI, I discussed that she could followup with orthopedics for this, she then begins to curse, walk out and say "I'm going to [place]".    Trevor Mace, PA-C 03/10/13 1155

## 2013-05-16 ENCOUNTER — Emergency Department (HOSPITAL_COMMUNITY)
Admission: EM | Admit: 2013-05-16 | Discharge: 2013-05-16 | Discharge status: Against Medical Advice | Payer: Medicaid Other | Attending: Emergency Medicine | Admitting: Emergency Medicine

## 2013-05-16 ENCOUNTER — Encounter (HOSPITAL_COMMUNITY): Payer: Self-pay | Admitting: Emergency Medicine

## 2013-05-16 DIAGNOSIS — Y939 Activity, unspecified: Secondary | ICD-10-CM | POA: Insufficient documentation

## 2013-05-16 DIAGNOSIS — IMO0002 Reserved for concepts with insufficient information to code with codable children: Secondary | ICD-10-CM | POA: Insufficient documentation

## 2013-05-16 DIAGNOSIS — J45909 Unspecified asthma, uncomplicated: Secondary | ICD-10-CM | POA: Insufficient documentation

## 2013-05-16 DIAGNOSIS — Y929 Unspecified place or not applicable: Secondary | ICD-10-CM | POA: Insufficient documentation

## 2013-05-16 DIAGNOSIS — F17200 Nicotine dependence, unspecified, uncomplicated: Secondary | ICD-10-CM | POA: Insufficient documentation

## 2013-05-16 HISTORY — DX: Disorder of kidney and ureter, unspecified: N28.9

## 2013-05-16 NOTE — ED Notes (Signed)
The patient said that the night before last night she felt like her foot was sore but she did not notice anything on it.  The patient said she does not have any bugs in the house but she thinks it is a spider that bit her.  She says she is having pain, redness and swelling.  She has tried an ice pack, elevated but did not take pain medication because she has "kidney disease" and her kidney doctor told her not to take any ibuprofen.  Patient rates her pain 10/10.  She advised me that her pain has gotten severe so she came to be evaluated.

## 2013-05-18 ENCOUNTER — Encounter (HOSPITAL_COMMUNITY): Payer: Self-pay | Admitting: Emergency Medicine

## 2013-05-18 ENCOUNTER — Emergency Department (HOSPITAL_COMMUNITY)
Admission: EM | Admit: 2013-05-18 | Discharge: 2013-05-18 | Discharge status: Against Medical Advice | Payer: Medicaid Other | Attending: Emergency Medicine | Admitting: Emergency Medicine

## 2013-05-18 DIAGNOSIS — J029 Acute pharyngitis, unspecified: Secondary | ICD-10-CM | POA: Insufficient documentation

## 2013-05-18 DIAGNOSIS — F17200 Nicotine dependence, unspecified, uncomplicated: Secondary | ICD-10-CM | POA: Insufficient documentation

## 2013-05-18 DIAGNOSIS — Z8669 Personal history of other diseases of the nervous system and sense organs: Secondary | ICD-10-CM | POA: Insufficient documentation

## 2013-05-18 DIAGNOSIS — Z8701 Personal history of pneumonia (recurrent): Secondary | ICD-10-CM | POA: Insufficient documentation

## 2013-05-18 DIAGNOSIS — J45909 Unspecified asthma, uncomplicated: Secondary | ICD-10-CM | POA: Insufficient documentation

## 2013-05-18 DIAGNOSIS — Z8619 Personal history of other infectious and parasitic diseases: Secondary | ICD-10-CM | POA: Insufficient documentation

## 2013-05-18 NOTE — ED Notes (Signed)
Called for room x1, no answer. 

## 2013-05-18 NOTE — ED Notes (Signed)
Pt c/o sore throat and and right sided head pain since last Friday.

## 2013-05-18 NOTE — ED Notes (Signed)
Patient not in waiting room for room placement. 

## 2013-09-14 ENCOUNTER — Emergency Department (HOSPITAL_COMMUNITY): Payer: Medicaid Other

## 2013-09-14 ENCOUNTER — Emergency Department (HOSPITAL_COMMUNITY)
Admission: EM | Admit: 2013-09-14 | Discharge: 2013-09-14 | Disposition: A | Discharge status: Home / Self Care | Payer: Medicaid Other | Attending: Emergency Medicine | Admitting: Emergency Medicine

## 2013-09-14 ENCOUNTER — Encounter (HOSPITAL_COMMUNITY): Payer: Self-pay | Admitting: Emergency Medicine

## 2013-09-14 DIAGNOSIS — Z87448 Personal history of other diseases of urinary system: Secondary | ICD-10-CM | POA: Insufficient documentation

## 2013-09-14 DIAGNOSIS — F17200 Nicotine dependence, unspecified, uncomplicated: Secondary | ICD-10-CM | POA: Diagnosis not present

## 2013-09-14 DIAGNOSIS — S6990XA Unspecified injury of unspecified wrist, hand and finger(s), initial encounter: Secondary | ICD-10-CM | POA: Diagnosis present

## 2013-09-14 DIAGNOSIS — S60012A Contusion of left thumb without damage to nail, initial encounter: Secondary | ICD-10-CM

## 2013-09-14 DIAGNOSIS — Z8701 Personal history of pneumonia (recurrent): Secondary | ICD-10-CM | POA: Insufficient documentation

## 2013-09-14 DIAGNOSIS — Z8669 Personal history of other diseases of the nervous system and sense organs: Secondary | ICD-10-CM | POA: Diagnosis not present

## 2013-09-14 DIAGNOSIS — Y939 Activity, unspecified: Secondary | ICD-10-CM | POA: Insufficient documentation

## 2013-09-14 DIAGNOSIS — W278XXA Contact with other nonpowered hand tool, initial encounter: Secondary | ICD-10-CM | POA: Insufficient documentation

## 2013-09-14 DIAGNOSIS — Z79899 Other long term (current) drug therapy: Secondary | ICD-10-CM | POA: Insufficient documentation

## 2013-09-14 DIAGNOSIS — J45909 Unspecified asthma, uncomplicated: Secondary | ICD-10-CM | POA: Insufficient documentation

## 2013-09-14 DIAGNOSIS — Z91040 Latex allergy status: Secondary | ICD-10-CM | POA: Insufficient documentation

## 2013-09-14 DIAGNOSIS — Y929 Unspecified place or not applicable: Secondary | ICD-10-CM | POA: Diagnosis not present

## 2013-09-14 DIAGNOSIS — S6000XA Contusion of unspecified finger without damage to nail, initial encounter: Secondary | ICD-10-CM | POA: Diagnosis not present

## 2013-09-14 MED ORDER — OXYCODONE-ACETAMINOPHEN 5-325 MG PO TABS
1.0000 | ORAL_TABLET | Freq: Once | ORAL | Status: AC
  Administered 2013-09-14: 1 via ORAL
  Filled 2013-09-14: qty 1

## 2013-09-14 MED ORDER — OXYCODONE-ACETAMINOPHEN 5-325 MG PO TABS: 1.0000 | ORAL_TABLET | ORAL | Status: DC | PRN

## 2013-09-14 NOTE — ED Provider Notes (Signed)
CSN: [id number]     Arrival date & time 09/14/13  1829 History   First MD Initiated Contact with Patient 09/14/13 1850     Chief Complaint  Patient presents with  . Hand Injury     (Consider location/radiation/quality/duration/timing/severity/associated sxs/prior Treatment) The history is provided by the patient.   Christine Vargas is a 38 y.o. female who presents to the Emergency Department complaining of pain, swelling to the left distal thumb.  She reports a crush injury to her thumb while using a wood splitter.  She describes a throbbing sensation to her thumb and states the pain is worse with movement or palpation to the tip of her thumb.  She has been applying ice w/o relief.  She denies pain or numbness above the distal thumb. Pt reports being UTD on last Td.      Past Medical History  Diagnosis Date  . Migraines   . Asthma   . Renal disorder   . Pneumonia   . Kidney disease    Past Surgical History  Procedure Laterality Date  . Finger surgery      ring finger left hand  . Mouth surgery     No family history on file. History  Substance Use Topics  . Smoking status: Current Every Day Smoker -- 0.50 packs/day for 20 years    Types: Cigarettes  . Smokeless tobacco: Never Used  . Alcohol Use: No   OB History   Grav Para Term Preterm Abortions TAB SAB Ect Mult Living   7 5 5  2  2   5      Review of Systems  Constitutional: Negative for fever and chills.  Genitourinary: Negative for dysuria and difficulty urinating.  Musculoskeletal: Positive for arthralgias and joint swelling.  Skin: Negative for color change and wound.  All other systems reviewed and are negative.     Allergies  Bee venom; Ibuprofen; Tylenol; Augmentin; Elastic bandages &; Gold-containing drug products; Grapefruit concentrate; Latex; Nickel; Other; Sulfa antibiotics; and Tramadol  Home Medications   Prior to Admission medications   Medication Sig Start Date End Date Taking? Authorizing  Provider  albuterol (PROVENTIL HFA;VENTOLIN HFA) 108 (90 BASE) MCG/ACT inhaler Inhale 1-2 puffs into the lungs every 6 (six) hours as needed for wheezing. 05/19/12   Tiffany Irine Seal, PA-C  metroNIDAZOLE (FLAGYL) 500 MG tablet Take 1 tablet (500 mg total) by mouth 2 (two) times daily. One po bid x 7 days 02/13/13   Donnetta Hutching, MD   BP 130/106  Pulse 78  Temp(Src) 98.5 F (36.9 C) (Oral)  Resp 20  Ht 5' 2.5" (1.588 m)  Wt 111 lb (50.349 kg)  BMI 19.97 kg/m2  SpO2 100%  LMP 09/14/2013 Physical Exam  Nursing note and vitals reviewed. Constitutional: She is oriented to person, place, and time. She appears well-developed and well-nourished. No distress.  HENT:  Head: Normocephalic and atraumatic.  Cardiovascular: Normal rate, regular rhythm, normal heart sounds and intact distal pulses.   No murmur heard. Pulmonary/Chest: Effort normal and breath sounds normal. No respiratory distress.  Musculoskeletal: She exhibits edema and tenderness.       Left hand: She exhibits decreased range of motion, tenderness, bony tenderness and swelling. She exhibits normal two-point discrimination, normal capillary refill, no deformity and no laceration. Normal sensation noted. Normal strength noted. She exhibits no thumb/finger opposition.       Hands: ttp and mild STS of the distal left thumb.    Small subungual hematoma and bruising to  the palmar aspect of the thumb.  Sensation intact. Proximal thumb and left wrist are NT.    Neurological: She is alert and oriented to person, place, and time. She exhibits normal muscle tone. Coordination normal.  Skin: Skin is warm and dry.    ED Course  Procedures (including critical care time) Labs Review Labs Reviewed - No data to display  Imaging Review Dg Finger Thumb Left  09/14/2013   CLINICAL DATA:  Left thumb injury  EXAM: LEFT THUMB 2+V  COMPARISON:  08/14/2012  FINDINGS: Three views of left thumb submitted. No acute fracture or subluxation. No radiopaque  foreign body.  IMPRESSION: Negative.   Electronically Signed   By: Natasha MeadLiviu  Pop M.D.   On: 09/14/2013 19:20     EKG Interpretation None      MDM   Final diagnoses:  Contusion, thumb, left, initial encounter    Pt has small subungual hematoma, recommended drainage, but pt refuses.  She is aware that she may lose the nail.    Thumb was bandaged and splinted.  Pt agrees to elevate, ice and orthopedic f/u.  She appears stable for d/c  Dannon Nguyenthi L. Trisha Mangleriplett, PA-C 09/16/13 [phone]

## 2013-09-14 NOTE — Discharge Instructions (Signed)
Contusion °A contusion is a deep bruise. Contusions happen when an injury causes bleeding under the skin. Signs of bruising include pain, puffiness (swelling), and discolored skin. The contusion may turn blue, purple, or yellow. °HOME CARE  °· Put ice on the injured area. °¨ Put ice in a plastic bag. °¨ Place a towel between your skin and the bag. °¨ Leave the ice on for 15-20 minutes, 03-04 times a day. °· Only take medicine as told by your doctor. °· Rest the injured area. °· If possible, raise (elevate) the injured area to lessen puffiness. °GET HELP RIGHT AWAY IF:  °· You have more bruising or puffiness. °· You have pain that is getting worse. °· Your puffiness or pain is not helped by medicine. °MAKE SURE YOU:  °· Understand these instructions. °· Will watch your condition. °· Will get help right away if you are not doing well or get worse. °Document Released: 07/09/2007 Document Revised: 04/14/2011 Document Reviewed: 11/25/2010 °Exit[employer]® Patient Information ©2015 [employer]. This information is not intended to replace advice given to you by your health care provider. Make sure you discuss any questions you have with your health care provider. ° °

## 2013-09-14 NOTE — ED Notes (Signed)
Pt ambulatory to triage, states smashed left hand in wood splitter about 20 min ago, pt has ice pack to hand, pain, pt states she bite down so hard she broke her bottom dentures

## 2013-09-18 NOTE — ED Provider Notes (Signed)
Medical screening examination/treatment/procedure(s) were performed by non-physician practitioner and as supervising physician I was immediately available for consultation/collaboration.   EKG Interpretation None      Devoria AlbeIva Syria Kestner, MD, Armando GangFACEP   Ward GivensIva L Yalena Colon, MD 09/18/13 [id number]

## 2013-09-30 ENCOUNTER — Encounter (HOSPITAL_COMMUNITY): Payer: Self-pay | Admitting: Emergency Medicine

## 2013-09-30 ENCOUNTER — Emergency Department (HOSPITAL_COMMUNITY)
Admission: EM | Admit: 2013-09-30 | Discharge: 2013-09-30 | Disposition: A | Discharge status: Home / Self Care | Payer: Medicaid Other | Attending: Emergency Medicine | Admitting: Emergency Medicine

## 2013-09-30 DIAGNOSIS — X58XXXA Exposure to other specified factors, initial encounter: Secondary | ICD-10-CM | POA: Diagnosis not present

## 2013-09-30 DIAGNOSIS — J45909 Unspecified asthma, uncomplicated: Secondary | ICD-10-CM | POA: Insufficient documentation

## 2013-09-30 DIAGNOSIS — Y9389 Activity, other specified: Secondary | ICD-10-CM | POA: Insufficient documentation

## 2013-09-30 DIAGNOSIS — S6990XA Unspecified injury of unspecified wrist, hand and finger(s), initial encounter: Secondary | ICD-10-CM | POA: Diagnosis present

## 2013-09-30 DIAGNOSIS — Z79899 Other long term (current) drug therapy: Secondary | ICD-10-CM | POA: Diagnosis not present

## 2013-09-30 DIAGNOSIS — Z8701 Personal history of pneumonia (recurrent): Secondary | ICD-10-CM | POA: Insufficient documentation

## 2013-09-30 DIAGNOSIS — Z8679 Personal history of other diseases of the circulatory system: Secondary | ICD-10-CM | POA: Diagnosis not present

## 2013-09-30 DIAGNOSIS — S6000XA Contusion of unspecified finger without damage to nail, initial encounter: Secondary | ICD-10-CM | POA: Insufficient documentation

## 2013-09-30 DIAGNOSIS — Z91040 Latex allergy status: Secondary | ICD-10-CM | POA: Diagnosis not present

## 2013-09-30 DIAGNOSIS — Y9289 Other specified places as the place of occurrence of the external cause: Secondary | ICD-10-CM | POA: Insufficient documentation

## 2013-09-30 DIAGNOSIS — F17200 Nicotine dependence, unspecified, uncomplicated: Secondary | ICD-10-CM | POA: Diagnosis not present

## 2013-09-30 DIAGNOSIS — Z87448 Personal history of other diseases of urinary system: Secondary | ICD-10-CM | POA: Insufficient documentation

## 2013-09-30 DIAGNOSIS — S60011D Contusion of right thumb without damage to nail, subsequent encounter: Secondary | ICD-10-CM

## 2013-09-30 MED ORDER — OXYCODONE-ACETAMINOPHEN 5-325 MG PO TABS
1.0000 | ORAL_TABLET | Freq: Once | ORAL | Status: AC
  Administered 2013-09-30: 1 via ORAL
  Filled 2013-09-30: qty 1

## 2013-09-30 MED ORDER — OXYCODONE-ACETAMINOPHEN 5-325 MG PO TABS: 1.0000 | ORAL_TABLET | ORAL | Status: DC | PRN

## 2013-09-30 NOTE — ED Notes (Signed)
Pain lt  Thumb, seen  Here on 8/12 for injury using a wood splitter, today was working with wood splitter and hurt her thumb again.  Has blackened area to pad of thumb and subungal hematoma also.

## 2013-09-30 NOTE — Discharge Instructions (Signed)
Contusion A contusion is a deep bruise. Contusions happen when an injury causes bleeding under the skin. Signs of bruising include pain, puffiness (swelling), and discolored skin. The contusion may turn blue, purple, or yellow. HOME CARE   Put ice on the injured area.  Put ice in a plastic bag.  Place a towel between your skin and the bag.  Leave the ice on for 15-20 minutes, 03-04 times a day.  Only take medicine as told by your doctor.  Rest the injured area.  If possible, raise (elevate) the injured area to lessen puffiness. GET HELP RIGHT AWAY IF:   You have more bruising or puffiness.  You have pain that is getting worse.  Your puffiness or pain is not helped by medicine. MAKE SURE YOU:   Understand these instructions.  Will watch your condition.  Will get help right away if you are not doing well or get worse. Document Released: 07/09/2007 Document Revised: 04/14/2011 Document Reviewed: 11/25/2010 [facility] Patient Information 2015 [place], [place]. This information is not intended to replace advice given to you by your health care provider. Make sure you discuss any questions you have with your health care provider.   Continue to use ice and elevation as much as possible  appears calm to alleviate pain and swelling. you may use the medication prescribed for pain.  Do not drive within 4 hours of taking this as it will make you drowsy.  See your Dr. for recheck if your symptoms are not improved over the next several days.

## 2013-10-03 NOTE — ED Provider Notes (Signed)
CSN: [id number]     Arrival date & time 09/30/13  1224 History   First MD Initiated Contact with Patient 09/30/13 1247     Chief Complaint  Patient presents with  . Hand Injury     (Consider location/radiation/quality/duration/timing/severity/associated sxs/prior Treatment) HPI  Christine Vargas is a 38 y.o. female presenting with a new injury to her left distal thumb.  She was seen here 2 weeks ago for a crush injury to her left thumb while using a wood splitter.  She was diagnosed with a subungual hematoma, but refused trephination of the nail.  Today she was stacking wood when she hit the thumb with a piece of wood causing worsened pain once again.  She denies any worse swelling or color changes to the finger,  But has severe throbbing pain.  She has taken no medicines prior to arrival and has found no relief from ice and elevation.      Past Medical History  Diagnosis Date  . Migraines   . Asthma   . Renal disorder   . Pneumonia   . Kidney disease    Past Surgical History  Procedure Laterality Date  . Finger surgery      ring finger left hand  . Mouth surgery     History reviewed. No pertinent family history. History  Substance Use Topics  . Smoking status: Current Every Day Smoker -- 0.50 packs/day for 20 years    Types: Cigarettes  . Smokeless tobacco: Never Used  . Alcohol Use: No   OB History   Grav Para Term Preterm Abortions TAB SAB Ect Mult Living   Review of Systems  Constitutional: Negative for fever.  Musculoskeletal: Positive for arthralgias. Negative for myalgias.  Skin: Positive for color change. Negative for wound.  Neurological: Negative for weakness and numbness.      Allergies  Bee venom; Ibuprofen; Tylenol; Augmentin; Elastic bandages &; Gold-containing drug products; Grapefruit concentrate; Latex; Nickel; Other; Sulfa antibiotics; and Tramadol  Home Medications   Prior to Admission medications   Medication Sig  Start Date End Date Taking? Authorizing Provider  albuterol (PROVENTIL HFA;VENTOLIN HFA) 108 (90 BASE) MCG/ACT inhaler Inhale 1-2 puffs into the lungs every 6 (six) hours as needed for wheezing. 05/19/12  Yes Tiffany Irine Seal, PA-C  oxyCODONE-acetaminophen (PERCOCET/ROXICET) 5-325 MG per tablet Take 1 tablet by mouth every 4 (four) hours as needed. 09/30/13   Burgess Amor, PA-C   BP 132/75  Pulse 89  Temp(Src) 99.4 F (37.4 C) (Oral)  Resp 16  Ht  (1.575 m)  Wt 114 lb (51.71 kg)  BMI 20.85 kg/m2  SpO2 100%  LMP 09/30/2013 Physical Exam  Constitutional: She appears well-developed and well-nourished.  HENT:  Head: Atraumatic.  Neck: Normal range of motion.  Cardiovascular:  Pulses equal bilaterally  Musculoskeletal: She exhibits tenderness.  ttp left distal thumb which has a subungual hematoma along with old ecchymosis of volar distal thumb.  Non tender proximal thumb.  FROM noted of mcp, limited of dip.  Distal sensation intact.   Neurological: She is alert. She has normal strength. She displays normal reflexes. No sensory deficit.  Skin: Skin is warm and dry.  Psychiatric: She has a normal mood and affect.    ED Course  Procedures (including critical care time) Labs Review Labs Reviewed - No data to display  Imaging Review No results found.   EKG Interpretation None  MDM   Final diagnoses:  Thumb contusion, right, subsequent encounter    Patients labs and/or radiological studies were viewed and considered during the medical decision making and disposition process. Offered trephination, pt declined.  Although hematoma may be old,  There also may be new blood with todays new injury and could greatly improve pain.  Pt still declines.  She was prescribed oxycodone, encouraged ice,  Elevation.  Prn f/u.  The patient appears reasonably screened and/or stabilized for discharge and I doubt any other medical condition or other [facility] requiring further screening, evaluation,  or treatment in the ED at this time prior to discharge.     Burgess Amor, PA-C 10/03/13 1538

## 2013-10-04 NOTE — ED Provider Notes (Signed)
Medical screening examination/treatment/procedure(s) were performed by non-physician practitioner and as supervising physician I was immediately available for consultation/collaboration.  Flint Melter, MD 10/04/13 [phone]

## 2013-10-11 ENCOUNTER — Encounter (HOSPITAL_COMMUNITY): Payer: Self-pay | Admitting: Emergency Medicine

## 2013-10-11 ENCOUNTER — Emergency Department (HOSPITAL_COMMUNITY)
Admission: EM | Admit: 2013-10-11 | Discharge: 2013-10-11 | Disposition: A | Discharge status: Home / Self Care | Payer: Medicaid Other | Attending: Emergency Medicine | Admitting: Emergency Medicine

## 2013-10-11 DIAGNOSIS — N939 Abnormal uterine and vaginal bleeding, unspecified: Secondary | ICD-10-CM

## 2013-10-11 DIAGNOSIS — R109 Unspecified abdominal pain: Secondary | ICD-10-CM | POA: Insufficient documentation

## 2013-10-11 DIAGNOSIS — F17200 Nicotine dependence, unspecified, uncomplicated: Secondary | ICD-10-CM | POA: Diagnosis not present

## 2013-10-11 DIAGNOSIS — Z91040 Latex allergy status: Secondary | ICD-10-CM | POA: Insufficient documentation

## 2013-10-11 DIAGNOSIS — Z79899 Other long term (current) drug therapy: Secondary | ICD-10-CM | POA: Insufficient documentation

## 2013-10-11 DIAGNOSIS — J45909 Unspecified asthma, uncomplicated: Secondary | ICD-10-CM | POA: Diagnosis not present

## 2013-10-11 DIAGNOSIS — Z3202 Encounter for pregnancy test, result negative: Secondary | ICD-10-CM | POA: Insufficient documentation

## 2013-10-11 DIAGNOSIS — Z87448 Personal history of other diseases of urinary system: Secondary | ICD-10-CM | POA: Insufficient documentation

## 2013-10-11 DIAGNOSIS — N898 Other specified noninflammatory disorders of vagina: Secondary | ICD-10-CM | POA: Insufficient documentation

## 2013-10-11 DIAGNOSIS — Z8701 Personal history of pneumonia (recurrent): Secondary | ICD-10-CM | POA: Diagnosis not present

## 2013-10-11 DIAGNOSIS — Z8679 Personal history of other diseases of the circulatory system: Secondary | ICD-10-CM | POA: Diagnosis not present

## 2013-10-11 LAB — WET PREP, GENITAL
Clue Cells Wet Prep HPF POC: NONE SEEN
Trich, Wet Prep: NONE SEEN
Yeast Wet Prep HPF POC: NONE SEEN

## 2013-10-11 LAB — URINALYSIS, ROUTINE W REFLEX MICROSCOPIC
Bilirubin Urine: NEGATIVE
Glucose, UA: NEGATIVE mg/dL
Ketones, ur: NEGATIVE mg/dL
Leukocytes, UA: NEGATIVE
Nitrite: NEGATIVE
Protein, ur: NEGATIVE mg/dL
Specific Gravity, Urine: 1.01 (ref 1.005–1.030)
Urobilinogen, UA: 0.2 mg/dL (ref 0.0–1.0)
pH: 7.5 (ref 5.0–8.0)

## 2013-10-11 LAB — CBC WITH DIFFERENTIAL/PLATELET
Basophils Absolute: 0 10*3/uL (ref 0.0–0.1)
Basophils Relative: 1 % (ref 0–1)
Eosinophils Absolute: 0.1 10*3/uL (ref 0.0–0.7)
Eosinophils Relative: 2 % (ref 0–5)
HCT: 35.2 % — ABNORMAL LOW (ref 36.0–46.0)
Hemoglobin: 11.6 g/dL — ABNORMAL LOW (ref 12.0–15.0)
Lymphocytes Relative: 17 % (ref 12–46)
Lymphs Abs: 1 10*3/uL (ref 0.7–4.0)
MCH: 27 pg (ref 26.0–34.0)
MCHC: 33 g/dL (ref 30.0–36.0)
MCV: 82.1 fL (ref 78.0–100.0)
Monocytes Absolute: 0.7 10*3/uL (ref 0.1–1.0)
Monocytes Relative: 12 % (ref 3–12)
Neutro Abs: 4 10*3/uL (ref 1.7–7.7)
Neutrophils Relative %: 68 % (ref 43–77)
Platelets: 258 10*3/uL (ref 150–400)
RBC: 4.29 MIL/uL (ref 3.87–5.11)
RDW: 17.8 % — ABNORMAL HIGH (ref 11.5–15.5)
WBC: 5.9 10*3/uL (ref 4.0–10.5)

## 2013-10-11 LAB — PREGNANCY, URINE: Preg Test, Ur: NEGATIVE

## 2013-10-11 LAB — URINE MICROSCOPIC-ADD ON

## 2013-10-11 MED ORDER — ACETAMINOPHEN 325 MG PO TABS
650.0000 mg | ORAL_TABLET | Freq: Once | ORAL | Status: DC
  Filled 2013-10-11: qty 2

## 2013-10-11 NOTE — ED Provider Notes (Signed)
CSN: [id number]     Arrival date & time 10/11/13  1123 History   First MD Initiated Contact with Patient 10/11/13 1205     Chief Complaint  Patient presents with  . Abdominal Pain  . Vaginal Bleeding     (Consider location/radiation/quality/duration/timing/severity/associated sxs/prior Treatment) HPI Christine Vargas is a 38 y.o. female who comes in for evaluation of abdominal pain and vaginal bleeding/discharge. She reports on Sunday she was having intercourse with her "well endowed" boyfriend and after intercourse she was bleeding copiously and had a foul smelling discharge. She states her last menstrual period was 10 days ago, and there was cessation of bleeding prior to intercourse. She denies the use of any sexual toys or foreign objects inserted into her vagina. She reports having changed her tampon 10 times on Sunday and since then has been using pads. She reports she will typically bleed after intercourse with her boyfriend, but this time it is much more has not stopped. Her abdominal pain is localized in the suprapubic region. She has not tried anything for the pain and she cannot find anything that makes it better or worse. She denies fever, nausea vomiting, dysuria, diarrhea, constipation. No bleeding disorders. She denies any type of contraception.  Past Medical History  Diagnosis Date  . Migraines   . Asthma   . Renal disorder   . Pneumonia   . Kidney disease    Past Surgical History  Procedure Laterality Date  . Finger surgery      ring finger left hand  . Mouth surgery     No family history on file. History  Substance Use Topics  . Smoking status: Current Every Day Smoker -- 0.50 packs/day for 20 years    Types: Cigarettes  . Smokeless tobacco: Never Used  . Alcohol Use: No   OB History   Grav Para Term Preterm Abortions TAB SAB Ect Mult Living   Review of Systems  Constitutional: Negative for fever.  HENT: Negative for congestion.    Eyes: Negative for visual disturbance.  Respiratory: Negative for shortness of breath.   Cardiovascular: Negative for chest pain.  Gastrointestinal: Positive for abdominal pain.  Genitourinary: Positive for vaginal bleeding and vaginal discharge.  Skin: Negative for rash.      Allergies  Bee venom; Ibuprofen; Tylenol; Augmentin; Elastic bandages &; Gold-containing drug products; Grapefruit concentrate; Latex; Nickel; Other; Sulfa antibiotics; and Tramadol  Home Medications   Prior to Admission medications   Medication Sig Start Date End Date Taking? Authorizing Provider  albuterol (PROVENTIL HFA;VENTOLIN HFA) 108 (90 BASE) MCG/ACT inhaler Inhale 2 puffs into the lungs every 6 (six) hours as needed for wheezing or shortness of breath.   Yes Historical Provider, MD  oxyCODONE-acetaminophen (PERCOCET/ROXICET) 5-325 MG per tablet Take 1 tablet by mouth every 4 (four) hours as needed. 09/30/13  Yes Burgess Amor, PA-C   BP 111/71  Pulse 56  Temp(Src) 98.7 F (37.1 C) (Oral)  Resp 22  SpO2 100%  LMP 09/30/2013 Physical Exam  Nursing note and vitals reviewed. Constitutional:  Awake, alert, nontoxic appearance.  HENT:  Head: Atraumatic.  Eyes: Right eye exhibits no discharge. Left eye exhibits no discharge.  Neck: Neck supple.  Pulmonary/Chest: Effort normal. She exhibits no tenderness.  Abdominal: Soft. There is no tenderness. There is no rebound.  Genitourinary:  Chaperone was present for the entire genital exam. No lesions or rashes appreciated on vulva. Small  laceration/tear, approx 2mm on R posterior vaginal wall. Small amount 3-4cc blood in vaginal vault. Cervix visualized on speculum exam and appropriate cultures sampled. Scant blood coming from cervical os. Discharge not appreciated Upon bi manual exam- No TTP of the adnexa, no cervical motion tenderness. No fullness or masses appreciated. No abnormalities appreciated in structural anatomy.   Musculoskeletal: She exhibits no  tenderness.  Baseline ROM, no obvious new focal weakness.  Neurological:  Mental status and motor strength appears baseline for patient and situation.  Skin: No rash noted.  Psychiatric: She has a normal mood and affect.    ED Course  Procedures (including critical care time) Labs Review Labs Reviewed  WET PREP, GENITAL - Abnormal; Notable for the following:    WBC, Wet Prep HPF POC RARE (*)    All other components within normal limits  URINALYSIS, ROUTINE W REFLEX MICROSCOPIC - Abnormal; Notable for the following:    APPearance CLOUDY (*)    Hgb urine dipstick LARGE (*)    All other components within normal limits  CBC WITH DIFFERENTIAL - Abnormal; Notable for the following:    Hemoglobin 11.6 (*)    HCT 35.2 (*)    RDW 17.8 (*)    All other components within normal limits  URINE MICROSCOPIC-ADD ON - Abnormal; Notable for the following:    Squamous Epithelial / LPF FEW (*)    Bacteria, UA FEW (*)    All other components within normal limits  GC/CHLAMYDIA PROBE AMP  PREGNANCY, URINE  RPR  HIV ANTIBODY (ROUTINE TESTING)    Imaging Review No results found.   EKG Interpretation None     Meds given in ED:  Medications  acetaminophen (TYLENOL) tablet 650 mg (650 mg Oral Not Given 10/11/13 1317)   Filed Vitals:   10/11/13 1157 10/11/13 1534  BP: 120/71 111/71  Pulse: 84 56  Temp: 98.7 F (37.1 C)   TempSrc: Oral   Resp: 18 22  SpO2: 100% 100%     New Prescriptions   No medications on file    MDM  Vitals stable - WNL -afebrile Pt resting comfortably in ED. PE showed small amount blood in the vaginal vault with no cervical motion tenderness or adnexal tenderness. Patient is one week removed from her menstrual cycle, small amount of blood remaining likely residual from previous menstrual cycle and small vaginal wall tear. No concern for ovarian torsion, PID, ectopic pregnancy, or other emergent pathology. Labwork essentially normal, hemoglobin slightly lower  than normal-not concerning at this time. Advised patient to be aware of symptoms of anemia. Patient declines pain medicine, states she cannot take ibuprofen and Tylenol and is allergic to tramadol. Advise patient nothing per vagina until bleeding stops. Use pads instead of tampons--return if bleeding does not stop. Discussed f/u with PCP and return precautions, pt very amenable to plan.   Final diagnoses:  Episode of heavy vaginal bleeding   Prior to patient discharge, I discussed and reviewed this case with Dr.Mark Sherwood Gambler, PA-C 10/11/13 1553

## 2013-10-11 NOTE — ED Notes (Addendum)
Initial Contact - pt A+Ox4, reports c/o lower abd pain, heavy vaginal bleeding and foul odor after having sexual intercourse x3 days ago.  Pt denies n/v/d/c, denies dizziness, weakness or syncope, denies dysuria.  Skin PWD.  MAEI, ambulatory with steady gait.  Changed to hospital gown and set up for pelvic exam.  NAD.

## 2013-10-11 NOTE — ED Notes (Addendum)
Pt declines tylenol, sts "my kidney specialist doesn't want me taking it".  Self repositioning for comfort and declines further intervention at this time. Pt aware of need for urine spec, sts unable to provide at this time.

## 2013-10-11 NOTE — ED Notes (Addendum)
Pt c/o lower abd pain and vaginal bleeding since Sunday.  Pt states that she been having to change tampon every 10 mins bc overflowing and pt has change pad around 30 mins.  Pt states that she is having large amounts of blood clots and very foul odor.   Pt states that she did have pretty rough sex Sunday for her boyfriend's birthday.  Pt would like to be checked for infections.

## 2013-10-11 NOTE — Discharge Instructions (Signed)
Abnormal Uterine Bleeding Abnormal uterine bleeding means bleeding from the vagina that is not your normal menstrual period. This can be:  Bleeding or spotting between periods.  Bleeding after sex (sexual intercourse).  Bleeding that is heavier or more than normal.  Periods that last longer than usual.  Bleeding after menopause. There are many problems that may cause this. Treatment will depend on the cause of the bleeding. Any kind of bleeding that is not normal should be reviewed by your doctor.  HOME CARE Watch your condition for any changes. These actions may lessen any discomfort you are having:  Do not use tampons or douches as told by your doctor.  Change your pads often. You should get regular pelvic exams and Pap tests. Keep all appointments for tests as told by your doctor. GET HELP IF:  You are bleeding for more than 1 week.  You feel dizzy at times. GET HELP RIGHT AWAY IF:   You pass out.  You have to change pads every 15 to 30 minutes.  You have belly pain.  You have a fever.  You become sweaty or weak.  You are passing large blood clots from the vagina.  You feel sick to your stomach (nauseous) and throw up (vomit). MAKE SURE YOU:  Understand these instructions.  Will watch your condition.  Will get help right away if you are not doing well or get worse. Document Released: 11/17/2008 Document Revised: 01/25/2013 Document Reviewed: 08/19/2012 [facility] Patient Information 2015 [place], [place]. This information is not intended to replace advice given to you by your health care provider. Make sure you discuss any questions you have with your health care provider.    Do not put anything in your vagina until bleeding stops. This includes cessation of intercourse. Use pads instead of tampons. Return to ED for further evaluation if your symptoms do not stop or you begin to experience fevers, dizziness, lightheadedness, shortness of breath, chest pain,  numbness or weakness.

## 2013-10-12 LAB — GC/CHLAMYDIA PROBE AMP
CT Probe RNA: NEGATIVE
GC Probe RNA: NEGATIVE

## 2013-10-19 NOTE — ED Provider Notes (Signed)
Medical screening examination/treatment/procedure(s) were performed by non-physician practitioner and as supervising physician I was immediately available for consultation/collaboration.   EKG Interpretation None        Cacie Gaskins, MD 10/19/13 1500 

## 2013-11-30 ENCOUNTER — Emergency Department (HOSPITAL_COMMUNITY)
Admission: EM | Admit: 2013-11-30 | Discharge: 2013-12-01 | Discharge status: Against Medical Advice | Payer: Medicaid Other | Attending: Emergency Medicine | Admitting: Emergency Medicine

## 2013-11-30 ENCOUNTER — Encounter (HOSPITAL_COMMUNITY): Payer: Self-pay | Admitting: Emergency Medicine

## 2013-11-30 DIAGNOSIS — S3992XA Unspecified injury of lower back, initial encounter: Secondary | ICD-10-CM | POA: Diagnosis present

## 2013-11-30 DIAGNOSIS — J45909 Unspecified asthma, uncomplicated: Secondary | ICD-10-CM | POA: Diagnosis not present

## 2013-11-30 DIAGNOSIS — Z720 Tobacco use: Secondary | ICD-10-CM | POA: Diagnosis not present

## 2013-11-30 DIAGNOSIS — W109XXA Fall (on) (from) unspecified stairs and steps, initial encounter: Secondary | ICD-10-CM | POA: Insufficient documentation

## 2013-11-30 NOTE — ED Notes (Signed)
Fell down 5 steps, onto concrete. Pain  Low back .  No LOC pain both hands

## 2013-12-05 ENCOUNTER — Encounter (HOSPITAL_COMMUNITY): Payer: Self-pay | Admitting: Emergency Medicine

## 2013-12-07 ENCOUNTER — Emergency Department (HOSPITAL_COMMUNITY): Payer: Medicaid Other

## 2013-12-07 ENCOUNTER — Encounter (HOSPITAL_COMMUNITY): Payer: Self-pay | Admitting: Emergency Medicine

## 2013-12-07 ENCOUNTER — Emergency Department (HOSPITAL_COMMUNITY)
Admission: EM | Admit: 2013-12-07 | Discharge: 2013-12-07 | Disposition: A | Discharge status: Home / Self Care | Payer: Medicaid Other | Attending: Emergency Medicine | Admitting: Emergency Medicine

## 2013-12-07 DIAGNOSIS — Z8679 Personal history of other diseases of the circulatory system: Secondary | ICD-10-CM | POA: Diagnosis not present

## 2013-12-07 DIAGNOSIS — Z87448 Personal history of other diseases of urinary system: Secondary | ICD-10-CM | POA: Insufficient documentation

## 2013-12-07 DIAGNOSIS — Y9289 Other specified places as the place of occurrence of the external cause: Secondary | ICD-10-CM | POA: Diagnosis not present

## 2013-12-07 DIAGNOSIS — Z720 Tobacco use: Secondary | ICD-10-CM | POA: Diagnosis not present

## 2013-12-07 DIAGNOSIS — W108XXA Fall (on) (from) other stairs and steps, initial encounter: Secondary | ICD-10-CM | POA: Insufficient documentation

## 2013-12-07 DIAGNOSIS — S300XXA Contusion of lower back and pelvis, initial encounter: Secondary | ICD-10-CM | POA: Diagnosis not present

## 2013-12-07 DIAGNOSIS — R51 Headache: Secondary | ICD-10-CM | POA: Diagnosis not present

## 2013-12-07 DIAGNOSIS — Z8701 Personal history of pneumonia (recurrent): Secondary | ICD-10-CM | POA: Diagnosis not present

## 2013-12-07 DIAGNOSIS — Y9389 Activity, other specified: Secondary | ICD-10-CM | POA: Diagnosis not present

## 2013-12-07 DIAGNOSIS — M545 Low back pain, unspecified: Secondary | ICD-10-CM

## 2013-12-07 DIAGNOSIS — Z91040 Latex allergy status: Secondary | ICD-10-CM | POA: Diagnosis not present

## 2013-12-07 DIAGNOSIS — Z79899 Other long term (current) drug therapy: Secondary | ICD-10-CM | POA: Insufficient documentation

## 2013-12-07 DIAGNOSIS — J45909 Unspecified asthma, uncomplicated: Secondary | ICD-10-CM | POA: Insufficient documentation

## 2013-12-07 DIAGNOSIS — Z3202 Encounter for pregnancy test, result negative: Secondary | ICD-10-CM | POA: Diagnosis not present

## 2013-12-07 DIAGNOSIS — R0981 Nasal congestion: Secondary | ICD-10-CM | POA: Diagnosis not present

## 2013-12-07 DIAGNOSIS — S3992XA Unspecified injury of lower back, initial encounter: Secondary | ICD-10-CM | POA: Diagnosis present

## 2013-12-07 LAB — POC URINE PREG, ED: Preg Test, Ur: NEGATIVE

## 2013-12-07 MED ORDER — ONDANSETRON HCL 4 MG PO TABS
4.0000 mg | ORAL_TABLET | Freq: Once | ORAL | Status: AC
  Administered 2013-12-07: 4 mg via ORAL
  Filled 2013-12-07: qty 1

## 2013-12-07 MED ORDER — DIAZEPAM 5 MG PO TABS
5.0000 mg | ORAL_TABLET | Freq: Once | ORAL | Status: AC
  Administered 2013-12-07: 5 mg via ORAL
  Filled 2013-12-07: qty 1

## 2013-12-07 MED ORDER — HYDROCODONE-ACETAMINOPHEN 5-325 MG PO TABS
1.0000 | ORAL_TABLET | Freq: Once | ORAL | Status: AC
  Administered 2013-12-07: 1 via ORAL
  Filled 2013-12-07: qty 1

## 2013-12-07 MED ORDER — METHOCARBAMOL 500 MG PO TABS: 500.0000 mg | ORAL_TABLET | Freq: Three times a day (TID) | ORAL | Status: DC

## 2013-12-07 MED ORDER — HYDROCODONE-ACETAMINOPHEN 5-325 MG PO TABS: 1.0000 | ORAL_TABLET | ORAL | Status: DC | PRN

## 2013-12-07 MED ORDER — ALBUTEROL SULFATE HFA 108 (90 BASE) MCG/ACT IN AERS
2.0000 | INHALATION_SPRAY | Freq: Once | RESPIRATORY_TRACT | Status: AC
  Administered 2013-12-07: 2 via RESPIRATORY_TRACT
  Filled 2013-12-07: qty 6.7

## 2013-12-07 NOTE — ED Provider Notes (Signed)
CSN: [id number]     Arrival date & time 12/07/13  0913 History   First MD Initiated Contact with Patient 12/07/13 0919     Chief Complaint  Patient presents with  . Back Pain     (Consider location/radiation/quality/duration/timing/severity/associated sxs/prior Treatment) HPI Comments: Pt reports falling down about 6 stairs  About 1 week ago. She c/o lower back pain that has continued since that time. Patient states she did not lose consciousness, or bowel or bladder function when this fall occurred. The patient attempted to be seen in the emergency department a few days ago, but came during a high volume time and left the emergency department before being seen. She has not been evaluated by her primary physician for this. She is not passing any blood in her stool, no blood in her urine. His been no high fever reported. There's been no unusual weakness in the lower extremities.  Patient is a 38 y.o. female presenting with back pain. The history is provided by the patient.  Back Pain Location:  Lumbar spine Associated symptoms: headaches   Associated symptoms: no abdominal pain, no chest pain and no dysuria     Past Medical History  Diagnosis Date  . Migraines   . Asthma   . Renal disorder   . Pneumonia   . Kidney disease    Past Surgical History  Procedure Laterality Date  . Finger surgery      ring finger left hand  . Mouth surgery     History reviewed. No pertinent family history. History  Substance Use Topics  . Smoking status: Current Every Day Smoker -- 0.50 packs/day for 20 years    Types: Cigarettes  . Smokeless tobacco: Never Used  . Alcohol Use: No   OB History    Gravida Para Term Preterm AB TAB SAB Ectopic Multiple Living   7 5 5  2  2   5      Review of Systems  Constitutional: Negative for activity change.       All ROS Neg except as noted in HPI  Eyes: Negative for photophobia and discharge.  Respiratory: Negative for cough, shortness of breath and  wheezing.   Cardiovascular: Negative for chest pain and palpitations.  Gastrointestinal: Negative for abdominal pain and blood in stool.  Genitourinary: Negative for dysuria, frequency and hematuria.  Musculoskeletal: Positive for back pain. Negative for arthralgias and neck pain.  Skin: Negative.   Neurological: Positive for headaches. Negative for dizziness, seizures and speech difficulty.  Psychiatric/Behavioral: Negative for hallucinations and confusion.      Allergies  Bee venom; Ibuprofen; Tylenol; Augmentin; Elastic bandages &; Gold-containing drug products; Grapefruit concentrate; Latex; Nickel; Other; Sulfa antibiotics; and Tramadol  Home Medications   Prior to Admission medications   Medication Sig Start Date End Date Taking? Authorizing Provider  albuterol (PROVENTIL HFA;VENTOLIN HFA) 108 (90 BASE) MCG/ACT inhaler Inhale 2 puffs into the lungs every 6 (six) hours as needed for wheezing or shortness of breath.    Historical Provider, MD  oxyCODONE-acetaminophen (PERCOCET/ROXICET) 5-325 MG per tablet Take 1 tablet by mouth every 4 (four) hours as needed. 09/30/13   Burgess AmorJulie Idol, PA-C   BP 105/75 mmHg  Pulse 86  Temp(Src) 98.6 F (37 C) (Oral)  Resp 18  Ht 5\' 2"  (1.575 m)  Wt 110 lb (49.896 kg)  BMI 20.11 kg/m2  SpO2 100%  LMP 11/16/2013 Physical Exam  Constitutional: She is oriented to person, place, and time. She appears well-developed and well-nourished.  Non-toxic  appearance.  HENT:  Head: Normocephalic.  Right Ear: Tympanic membrane and external ear normal.  Left Ear: Tympanic membrane and external ear normal.  Nasal congestion present.  Eyes: EOM and lids are normal. Pupils are equal, round, and reactive to light.  Neck: Normal range of motion. Neck supple. Carotid bruit is not present.  Cardiovascular: Normal rate, regular rhythm, normal heart sounds, intact distal pulses and normal pulses.   Pulmonary/Chest: Effort normal. No accessory muscle usage. No  respiratory distress. She has rhonchi.  Abdominal: Soft. Bowel sounds are normal. There is no tenderness. There is no guarding.  Musculoskeletal: Normal range of motion.  Pain to palpation over the sacral and coccyx area. No bruising noted. No hot areas.  No palpable step off of the lumbar spine.  Lymphadenopathy:       Head (right side): No submandibular adenopathy present.       Head (left side): No submandibular adenopathy present.    She has no cervical adenopathy.  Neurological: She is alert and oriented to person, place, and time. She has normal strength. No cranial nerve deficit or sensory deficit.  Gait is steady without problem.  Skin: Skin is warm and dry.  Psychiatric: She has a normal mood and affect. Her speech is normal.  Nursing note and vitals reviewed.   ED Course  Procedures (including critical care time) Labs Review Labs Reviewed - No data to display  Imaging Review No results found.   EKG Interpretation None      MDM  Vital signs are well within normal limits. X-ray of the sacral coccyx region is negative for fracture or dislocation. Patient is ambulatory with some soreness, but without complication.  Patient advised to use a doughnut pillow, and prescription for Norco and Robaxin given to the patient to use. Patient is referred to Dr. Romeo AppleHarrison (orthopedics) for additional evaluation if not improving.   Final diagnoses:  Low back pain  Contusion of coccyx, initial encounter    **I have reviewed nursing notes, vital signs, and all appropriate lab and imaging results for this patient.Kathie Dike*    Denilson Salminen M Herbert Aguinaldo, PA-C 12/07/13 1120  Juliet RudeNathan R. Rubin PayorPickering, MD 12/09/13 2150

## 2013-12-07 NOTE — ED Notes (Signed)
Pt reports slipped down approximately 6 stairs on Wednesday. Pt denies hitting head and loc. Pt reports continued back pain. Pt alert and oriented. Pt able to ambulate with steady gait. Pt also reports chronic cough and congestion. nad noted.

## 2013-12-07 NOTE — Discharge Instructions (Signed)
Your x-ray is negative for pelvic, coccyx, or sacral fractures or dislocations. You may benefit from using a pillow, or sitting on a doughnut pillow. Please use Robaxin and Norco for your discomfort. Both of these medications may cause drowsiness, please use with caution. Please see Dr. Romeo AppleHarrison for orthopedic evaluation if not improving as your x-rays here in the emergency department have been negative to this point. Tailbone Injury The tailbone is the small bone at the lower end of the backbone (spine). You may have stretched tissues, bruises, or a broken bone (fracture). Most tailbone injuries get better on their own after 4 to 6 weeks. HOME CARE  Put ice on the injured area.  Put ice in a plastic bag.  Place a towel between your skin and the bag.  Leave the ice on for 15-20 minutes. Do this every hour while you are awake for 1 to 2 days.  Sit on a large, rubber or inflated ring or cushion to lessen pain. Lean forward when you sit to help lessen pain.  Avoid sitting in one place for a long time.  Increase your activity as the pain allows.  Only take medicines as told by your doctor.  You can take medicine to help you poop (stool softeners) if it is painful to poop.  Eat foods with plenty of fiber.  Keep all doctor visits as told. GET HELP RIGHT AWAY IF:  Your pain gets worse.  Pooping causes you pain.  You cannot poop (constipation).  You have a fever. MAKE SURE YOU:  Understand these instructions.  Will watch your condition.  Will get help right away if you are not doing well or get worse. Document Released: 02/22/2010 Document Revised: 04/14/2011 Document Reviewed: 08/15/2010 [facility] Patient Information 2015 [place], [place]. This information is not intended to replace advice given to you by your health care provider. Make sure you discuss any questions you have with your health care provider.

## 2013-12-07 NOTE — ED Notes (Signed)
PA at bedside.

## 2014-06-05 ENCOUNTER — Emergency Department (HOSPITAL_COMMUNITY)
Admission: EM | Admit: 2014-06-05 | Discharge: 2014-06-05 | Disposition: A | Discharge status: Home / Self Care | Payer: Medicaid Other | Attending: Emergency Medicine | Admitting: Emergency Medicine

## 2014-06-05 ENCOUNTER — Encounter (HOSPITAL_COMMUNITY): Payer: Self-pay | Admitting: Emergency Medicine

## 2014-06-05 DIAGNOSIS — Z8701 Personal history of pneumonia (recurrent): Secondary | ICD-10-CM | POA: Insufficient documentation

## 2014-06-05 DIAGNOSIS — Z79899 Other long term (current) drug therapy: Secondary | ICD-10-CM | POA: Insufficient documentation

## 2014-06-05 DIAGNOSIS — Z3202 Encounter for pregnancy test, result negative: Secondary | ICD-10-CM | POA: Insufficient documentation

## 2014-06-05 DIAGNOSIS — Y9389 Activity, other specified: Secondary | ICD-10-CM | POA: Insufficient documentation

## 2014-06-05 DIAGNOSIS — S39012A Strain of muscle, fascia and tendon of lower back, initial encounter: Secondary | ICD-10-CM | POA: Diagnosis not present

## 2014-06-05 DIAGNOSIS — Z8679 Personal history of other diseases of the circulatory system: Secondary | ICD-10-CM | POA: Insufficient documentation

## 2014-06-05 DIAGNOSIS — J45909 Unspecified asthma, uncomplicated: Secondary | ICD-10-CM | POA: Diagnosis not present

## 2014-06-05 DIAGNOSIS — S3992XA Unspecified injury of lower back, initial encounter: Secondary | ICD-10-CM | POA: Diagnosis present

## 2014-06-05 DIAGNOSIS — Y998 Other external cause status: Secondary | ICD-10-CM | POA: Insufficient documentation

## 2014-06-05 DIAGNOSIS — X58XXXA Exposure to other specified factors, initial encounter: Secondary | ICD-10-CM | POA: Diagnosis not present

## 2014-06-05 DIAGNOSIS — Z720 Tobacco use: Secondary | ICD-10-CM | POA: Diagnosis not present

## 2014-06-05 DIAGNOSIS — Y9289 Other specified places as the place of occurrence of the external cause: Secondary | ICD-10-CM | POA: Insufficient documentation

## 2014-06-05 DIAGNOSIS — Z8742 Personal history of other diseases of the female genital tract: Secondary | ICD-10-CM | POA: Diagnosis not present

## 2014-06-05 DIAGNOSIS — Z91040 Latex allergy status: Secondary | ICD-10-CM | POA: Insufficient documentation

## 2014-06-05 LAB — URINALYSIS, ROUTINE W REFLEX MICROSCOPIC
Bilirubin Urine: NEGATIVE
Glucose, UA: NEGATIVE mg/dL
Hgb urine dipstick: NEGATIVE
Ketones, ur: NEGATIVE mg/dL
Leukocytes, UA: NEGATIVE
Nitrite: NEGATIVE
Protein, ur: NEGATIVE mg/dL
Specific Gravity, Urine: 1.015 (ref 1.005–1.030)
Urobilinogen, UA: 0.2 mg/dL (ref 0.0–1.0)
pH: 6 (ref 5.0–8.0)

## 2014-06-05 LAB — POC URINE PREG, ED: Preg Test, Ur: NEGATIVE

## 2014-06-05 MED ORDER — CYCLOBENZAPRINE HCL 10 MG PO TABS: 10.0000 mg | ORAL_TABLET | Freq: Three times a day (TID) | ORAL | Status: DC | PRN

## 2014-06-05 MED ORDER — HYDROCODONE-ACETAMINOPHEN 5-325 MG PO TABS: ORAL_TABLET | ORAL | Status: DC

## 2014-06-05 NOTE — ED Provider Notes (Signed)
CSN: [id number]     Arrival date & time 06/05/14  1256 History  This chart was scribed for non-physician practitioner, Pauline Ausammy Krishawna Stiefel, PA-C, working with Donnetta HutchingBrian Cook, MD, by Modena JanskyAlbert Thayil, ED Scribe. This patient was seen in room APFT22/APFT22 and the patient's care was started at 1:26 PM.  Chief Complaint  Patient presents with  . Back Pain   The history is provided by the patient. No language interpreter was used.    HPI Comments: Christine Vargas is a 39 y.o. female who presents to the Emergency Department complaining of constant moderate back pain that started a few days ago. She reports that she has been moving furniture all week, and recently started having a gradual onset of non radiating pain as a result. She states that movement exacerbates the pain. She states that she had no obvious injury. She reports that she used a heating pad without any relief. She denies any bowel or bladder incontinence, fever, chills, abdominal pain, numbness or weakness of the extremities or dysuria.   Past Medical History  Diagnosis Date  . Migraines   . Asthma   . Renal disorder   . Pneumonia   . Kidney disease    Past Surgical History  Procedure Laterality Date  . Finger surgery      ring finger left hand  . Mouth surgery     History reviewed. No pertinent family history. History  Substance Use Topics  . Smoking status: Current Every Day Smoker -- 0.50 packs/day for 20 years    Types: Cigarettes  . Smokeless tobacco: Never Used  . Alcohol Use: No   OB History    Gravida Para Term Preterm AB TAB SAB Ectopic Multiple Living   7 5 5  2  2   5      Review of Systems  Constitutional: Negative for fever, appetite change and fatigue.  HENT: Negative for congestion, ear discharge and sinus pressure.   Eyes: Negative for discharge.  Respiratory: Negative for cough and shortness of breath.   Cardiovascular: Negative for chest pain.  Gastrointestinal: Negative for vomiting, abdominal pain,  diarrhea and constipation.  Genitourinary: Negative for dysuria, frequency, hematuria, flank pain, decreased urine volume and difficulty urinating.  Musculoskeletal: Positive for back pain. Negative for joint swelling.  Skin: Negative for rash.  Neurological: Negative for seizures, weakness, numbness and headaches.  Psychiatric/Behavioral: Negative for hallucinations.  All other systems reviewed and are negative.  Allergies  Bee venom; Ibuprofen; Augmentin; Elastic bandages &; Gold-containing drug products; Grapefruit concentrate; Latex; Nickel; Other; Sulfa antibiotics; and Tramadol  Home Medications   Prior to Admission medications   Medication Sig Start Date End Date Taking? Authorizing Provider  albuterol (PROVENTIL HFA;VENTOLIN HFA) 108 (90 BASE) MCG/ACT inhaler Inhale 2 puffs into the lungs every 6 (six) hours as needed for wheezing or shortness of breath.    Historical Provider, MD  HYDROcodone-acetaminophen (NORCO/VICODIN) 5-325 MG per tablet Take 1 tablet by mouth every 4 (four) hours as needed. 12/07/13   Ivery QualeHobson Bryant, PA-C  methocarbamol (ROBAXIN) 500 MG tablet Take 1 tablet (500 mg total) by mouth 3 (three) times daily. 12/07/13   Ivery QualeHobson Bryant, PA-C  oxyCODONE-acetaminophen (PERCOCET/ROXICET) 5-325 MG per tablet Take 1 tablet by mouth every 4 (four) hours as needed. Patient not taking: Reported on 12/07/2013 09/30/13   Burgess AmorJulie Idol, PA-C   BP 115/66 mmHg  Pulse 67  Temp(Src) 98.7 F (37.1 C) (Oral)  Resp 18  Ht 5\' 2"  (1.575 m)  Wt 137 lb (62.143  kg)  BMI 25.05 kg/m2  SpO2 98%  LMP 05/05/2014 Physical Exam  Constitutional: She is oriented to person, place, and time. She appears well-developed and well-nourished. No distress.  HENT:  Head: Normocephalic and atraumatic.  Neck: Normal range of motion. Neck supple. No tracheal deviation present.  Cardiovascular: Normal rate, regular rhythm, normal heart sounds and intact distal pulses.   No murmur heard. Pulmonary/Chest:  Effort normal and breath sounds normal. No respiratory distress.  Abdominal: Soft. She exhibits no distension. There is no tenderness.  Musculoskeletal: Normal range of motion. She exhibits tenderness. She exhibits no edema.       Lumbar back: She exhibits tenderness and pain. She exhibits normal range of motion, no swelling, no deformity, no laceration and normal pulse.  Diffuse TTP of the lumbar spine and paraspinal muscles. No edema. 5/5 strength against resistance of the BLE.   Neurological: She is alert and oriented to person, place, and time. She has normal strength. No sensory deficit. She exhibits normal muscle tone. Coordination and gait normal.  Reflex Scores:      Patellar reflexes are 2+ on the right side and 2+ on the left side.      Achilles reflexes are 2+ on the right side and 2+ on the left side. Skin: Skin is warm and dry. No rash noted.  Psychiatric: She has a normal mood and affect. Her behavior is normal.  Nursing note and vitals reviewed.   ED Course  Procedures (including critical care time) DIAGNOSTIC STUDIES: Oxygen Saturation is 98% on RA, normal by my interpretation.    COORDINATION OF CARE: 1:30 PM- Pt advised of plan for treatment which includes labs and pt agrees.  Labs Review Labs Reviewed  URINALYSIS, ROUTINE W REFLEX MICROSCOPIC  POC URINE PREG, ED    Imaging Review No results found.   EKG Interpretation None      MDM   Final diagnoses:  Lumbar strain, initial encounter    Pt is ambulatory, no focal neuro deficits.  No concerning sx's for emergent neurological or infectious process.  Likely strain.  Pt agrees to symptomatic tx.     I personally performed the services described in this documentation, which was scribed in my presence. The recorded information has been reviewed and is accurate.     Pauline Aus, PA-C 06/06/14 2143  Donnetta Hutching, MD 06/07/14 1257

## 2014-06-05 NOTE — Discharge Instructions (Signed)

## 2014-06-05 NOTE — ED Notes (Signed)
Pt reports has been moving furniture and reports back pain ever since.

## 2014-06-11 ENCOUNTER — Emergency Department (HOSPITAL_COMMUNITY)
Admission: EM | Admit: 2014-06-11 | Discharge: 2014-06-11 | Disposition: A | Discharge status: Home / Self Care | Payer: Medicaid Other | Attending: Emergency Medicine | Admitting: Emergency Medicine

## 2014-06-11 ENCOUNTER — Encounter (HOSPITAL_COMMUNITY): Payer: Self-pay | Admitting: Emergency Medicine

## 2014-06-11 DIAGNOSIS — J45909 Unspecified asthma, uncomplicated: Secondary | ICD-10-CM | POA: Insufficient documentation

## 2014-06-11 DIAGNOSIS — Z720 Tobacco use: Secondary | ICD-10-CM | POA: Insufficient documentation

## 2014-06-11 DIAGNOSIS — Z87448 Personal history of other diseases of urinary system: Secondary | ICD-10-CM | POA: Insufficient documentation

## 2014-06-11 DIAGNOSIS — Z79899 Other long term (current) drug therapy: Secondary | ICD-10-CM | POA: Insufficient documentation

## 2014-06-11 DIAGNOSIS — M545 Low back pain: Secondary | ICD-10-CM | POA: Diagnosis present

## 2014-06-11 DIAGNOSIS — G43909 Migraine, unspecified, not intractable, without status migrainosus: Secondary | ICD-10-CM | POA: Insufficient documentation

## 2014-06-11 DIAGNOSIS — S39012D Strain of muscle, fascia and tendon of lower back, subsequent encounter: Secondary | ICD-10-CM | POA: Insufficient documentation

## 2014-06-11 DIAGNOSIS — Z8701 Personal history of pneumonia (recurrent): Secondary | ICD-10-CM | POA: Insufficient documentation

## 2014-06-11 DIAGNOSIS — X58XXXD Exposure to other specified factors, subsequent encounter: Secondary | ICD-10-CM | POA: Insufficient documentation

## 2014-06-11 DIAGNOSIS — Z91040 Latex allergy status: Secondary | ICD-10-CM | POA: Diagnosis not present

## 2014-06-11 MED ORDER — PREDNISONE 10 MG PO TABS
ORAL_TABLET | ORAL | Status: DC
Start: 2014-06-11 — End: 2016-01-17

## 2014-06-11 MED ORDER — OXYCODONE-ACETAMINOPHEN 5-325 MG PO TABS
1.0000 | ORAL_TABLET | Freq: Once | ORAL | Status: AC
  Administered 2014-06-11: 1 via ORAL
  Filled 2014-06-11: qty 1

## 2014-06-11 NOTE — ED Notes (Signed)
Pt states she was here on the 2nd for same. Lower back pain has continued since then. Pt states she initially hurt her back while moving furniture, she believes.

## 2014-06-11 NOTE — Discharge Instructions (Signed)
Back Pain, Adult °Back pain is very common. The pain often gets better over time. The cause of back pain is usually not dangerous. Most people can learn to manage their back pain on their own.  °HOME CARE  °· Stay active. Start with short walks on flat ground if you can. Try to walk farther each day. °· Do not sit, drive, or stand in one place for more than 30 minutes. Do not stay in bed. °· Do not avoid exercise or work. Activity can help your back heal faster. °· Be careful when you bend or lift an object. Bend at your knees, keep the object close to you, and do not twist. °· Sleep on a firm mattress. Lie on your side, and bend your knees. If you lie on your back, put a pillow under your knees. °· Only take medicines as told by your doctor. °· Put ice on the injured area. °¨ Put ice in a plastic bag. °¨ Place a towel between your skin and the bag. °¨ Leave the ice on for 15-20 minutes, 03-04 times a day for the first 2 to 3 days. After that, you can switch between ice and heat packs. °· Ask your doctor about back exercises or massage. °· Avoid feeling anxious or stressed. Find good ways to deal with stress, such as exercise. °GET HELP RIGHT AWAY IF:  °· Your pain does not go away with rest or medicine. °· Your pain does not go away in 1 week. °· You have new problems. °· You do not feel well. °· The pain spreads into your legs. °· You cannot control when you poop (bowel movement) or pee (urinate). °· Your arms or legs feel weak or lose feeling (numbness). °· You feel sick to your stomach (nauseous) or throw up (vomit). °· You have belly (abdominal) pain. °· You feel like you may pass out (faint). °MAKE SURE YOU:  °· Understand these instructions. °· Will watch your condition. °· Will get help right away if you are not doing well or get worse. °Document Released: 07/09/2007 Document Revised: 04/14/2011 Document Reviewed: 05/24/2013 °[employer]® Patient Information ©2015 [employer]. This information is not intended  to replace advice given to you by your health care provider. Make sure you discuss any questions you have with your health care provider. ° °

## 2014-06-11 NOTE — ED Provider Notes (Signed)
CSN: [id number]     Arrival date & time 06/11/14  1521 History  This chart was scribed for a non-physician practitioner, Pauline Ausammy Cassadee Vanzandt, PA-C working with Donnetta HutchingBrian Cook, MD by [place] Peace, ED Scribe. The patient was seen in APFT24/APFT24. The patient's care was started at 4:22 PM.    Chief Complaint  Patient presents with  . Back Pain     Patient is a 39 y.o. female presenting with back pain. The history is provided by the patient. No language interpreter was used.  Back Pain Location:  Lumbar spine Pain severity:  Severe Timing:  Constant Progression:  Unchanged Context: lifting heavy objects   Ineffective treatments:  Muscle relaxants Associated symptoms: no abdominal pain, no chest pain, no dysuria, no numbness and no weakness   HPI Comments: Christine Vargas is a 39 y.o. female who presents to the Emergency Department complaining of continued  lower back pain that she explains is from moving furniture one week ago. Pain is worse on the left side. Pt was seen here 6 days ago for same issue and prescribed pain medication as well muscle relaxer's to treat symptoms. She reports she has finished her pain medication but is still not experiencing any relief. She further reports she is not able to sit up straight due to pain and pain is worse with weight bearing.  She denies urine or bowel changes, numbness or weakness of the lower extremities.  She has not tried to arrange PMD f/u since her previous visit.   History of degenerative bone isease. Pt is current everyday smoker.    Past Medical History  Diagnosis Date  . Migraines   . Asthma   . Renal disorder   . Pneumonia   . Kidney disease    Past Surgical History  Procedure Laterality Date  . Finger surgery      ring finger left hand  . Mouth surgery     No family history on file. History  Substance Use Topics  . Smoking status: Current Every Day Smoker -- 0.50 packs/day for 20 years    Types: Cigarettes  . Smokeless tobacco:  Never Used  . Alcohol Use: No   OB History    Gravida Para Term Preterm AB TAB SAB Ectopic Multiple Living   7 5 5  2  2   5      Review of Systems  Constitutional: Negative for appetite change and fatigue.  Eyes: Negative for discharge.  Respiratory: Negative for cough and shortness of breath.   Cardiovascular: Negative for chest pain.  Gastrointestinal: Negative for nausea, vomiting and abdominal pain.  Genitourinary: Negative for dysuria, frequency, hematuria and flank pain.  Musculoskeletal: Positive for back pain.  Skin: Negative for rash.  Neurological: Negative for weakness and numbness.  Psychiatric/Behavioral: Negative for hallucinations.  All other systems reviewed and are negative.     Allergies  Bee venom; Ibuprofen; Augmentin; Elastic bandages &; Gold-containing drug products; Grapefruit concentrate; Latex; Nickel; Other; Sulfa antibiotics; and Tramadol  Home Medications   Prior to Admission medications   Medication Sig Start Date End Date Taking? Authorizing Provider  albuterol (PROVENTIL HFA;VENTOLIN HFA) 108 (90 BASE) MCG/ACT inhaler Inhale 2 puffs into the lungs every 6 (six) hours as needed for wheezing or shortness of breath.   Yes Historical Provider, MD  cyclobenzaprine (FLEXERIL) 10 MG tablet Take 1 tablet (10 mg total) by mouth 3 (three) times daily as needed. 06/05/14  Yes Marcine Gadway, PA-C  HYDROcodone-acetaminophen (NORCO/VICODIN) 5-325 MG per tablet Take  one-two tabs po q 4-6 hrs prn pain Patient not taking: Reported on 06/11/2014 06/05/14   Krystal Delduca, PA-C  methocarbamol (ROBAXIN) 500 MG tablet Take 1 tablet (500 mg total) by mouth 3 (three) times daily. Patient not taking: Reported on 06/11/2014 12/07/13   Ivery QualeHobson Bryant, PA-C  oxyCODONE-acetaminophen (PERCOCET/ROXICET) 5-325 MG per tablet Take 1 tablet by mouth every 4 (four) hours as needed. Patient not taking: Reported on 12/07/2013 09/30/13   Burgess AmorJulie Idol, PA-C   BP 104/66 mmHg  Pulse 74  Temp(Src)  98.6 F (37 C) (Oral)  Resp 20  Ht 5' 2.5" (1.588 m)  Wt 132 lb (59.875 kg)  BMI 23.74 kg/m2  SpO2 99%  LMP 05/05/2014 Physical Exam  Constitutional: She is oriented to person, place, and time. She appears well-developed and well-nourished. No distress.  HENT:  Head: Normocephalic and atraumatic.  Eyes: Conjunctivae and EOM are normal.  Neck: Neck supple. No tracheal deviation present.  Cardiovascular: Normal rate, regular rhythm, normal heart sounds and intact distal pulses.  Exam reveals no gallop and no friction rub.   No murmur heard. Pulmonary/Chest: Effort normal and breath sounds normal. No respiratory distress.  Musculoskeletal: Normal range of motion. She exhibits tenderness.  Diffuse tenderness to bilateral lumbar paraspinal muscles. No bony step offs.  Positive straight leg raise bilaterally, 15 degrees. 5/5 strength against resistance bilaterally  Neurological: She is alert and oriented to person, place, and time. She has normal strength. Gait normal.  Reflex Scores:      Patellar reflexes are 2+ on the right side and 2+ on the left side.      Achilles reflexes are 2+ on the right side and 2+ on the left side. Skin: Skin is warm and dry.  Psychiatric: She has a normal mood and affect. Her behavior is normal.  Nursing note and vitals reviewed.   ED Course  Procedures (including critical care time) Labs Review Labs Reviewed - No data to display  Imaging Review No results found.   EKG Interpretation None     Medications - No data to display  4:26 PM- Treatment plan was discussed with patient who verbalizes understanding and agrees.   MDM   Final diagnoses:  Lumbar strain, subsequent encounter    Pt was seen here last week by me.  Neg urine preg and u/a.  She is ambulatory with a steady gait.  No focal neuro deficits,  No concerning sx's for emergent neurological or infectious process.  Likely lumbar strain.  I have advised pt that she needs to arrange PMD  f/u.  No clinical indication for imaging at this time. Referral info given.  Understands that further narcotics are not indicated at this time.  Appears stable for d/c  I personally performed the services described in this documentation, which was scribed in my presence. The recorded information has been reviewed and is accurate.    Pauline Ausammy Javeah Loeza, PA-C 06/11/14 1802  Donnetta HutchingBrian Cook, MD 06/11/14 [phone]

## 2016-01-17 ENCOUNTER — Encounter (HOSPITAL_COMMUNITY): Payer: Self-pay

## 2016-01-17 ENCOUNTER — Emergency Department (HOSPITAL_COMMUNITY)
Admission: EM | Admit: 2016-01-17 | Discharge: 2016-01-18 | Disposition: A | Discharge status: Home / Self Care | Payer: Medicaid Other | Attending: Emergency Medicine | Admitting: Emergency Medicine

## 2016-01-17 DIAGNOSIS — J029 Acute pharyngitis, unspecified: Secondary | ICD-10-CM | POA: Insufficient documentation

## 2016-01-17 DIAGNOSIS — F17210 Nicotine dependence, cigarettes, uncomplicated: Secondary | ICD-10-CM | POA: Insufficient documentation

## 2016-01-17 DIAGNOSIS — Z79899 Other long term (current) drug therapy: Secondary | ICD-10-CM | POA: Insufficient documentation

## 2016-01-17 DIAGNOSIS — J45909 Unspecified asthma, uncomplicated: Secondary | ICD-10-CM | POA: Insufficient documentation

## 2016-01-17 LAB — RAPID STREP SCREEN (MED CTR MEBANE ONLY): Streptococcus, Group A Screen (Direct): NEGATIVE

## 2016-01-17 MED ORDER — LIDOCAINE HCL 2 % EX GEL
1.0000 "application " | Freq: Once | CUTANEOUS | Status: AC
  Administered 2016-01-17: 1 via TOPICAL
  Filled 2016-01-17: qty 10

## 2016-01-17 MED ORDER — ACETAMINOPHEN 325 MG PO TABS
650.0000 mg | ORAL_TABLET | Freq: Once | ORAL | Status: AC
Start: 2016-01-17 — End: 2016-01-17
  Administered 2016-01-17: 650 mg via ORAL
  Filled 2016-01-17: qty 2

## 2016-01-17 NOTE — ED Triage Notes (Signed)
My throat and lip hurts really bad.  Started 2 days ago per pt.

## 2016-01-18 MED ORDER — ALBUTEROL SULFATE HFA 108 (90 BASE) MCG/ACT IN AERS
2.0000 | INHALATION_SPRAY | RESPIRATORY_TRACT | Status: DC | PRN
  Administered 2016-01-18: 2 via RESPIRATORY_TRACT
  Filled 2016-01-18: qty 6.7

## 2016-01-18 MED ORDER — MAGIC MOUTHWASH W/LIDOCAINE
10.0000 mL | Freq: Four times a day (QID) | ORAL | 0 refills | Status: DC | PRN
Start: 2016-01-18 — End: 2016-03-16

## 2016-01-18 NOTE — ED Provider Notes (Signed)
[facility]EM[facility] DEPT Provider Note   CSN: [id number] Arrival date & time: 01/17/16  2232     History   Chief Complaint Chief Complaint  Patient presents with  . Sore Throat    HPI Christine Vargas is a 40 y.o. female presenting with .  The history is provided by the patient.  Sore Throat  This is a new problem. The current episode started 2 days ago. The problem occurs constantly. The problem has not changed since onset.Pertinent negatives include no chest pain, no abdominal pain, no headaches and no shortness of breath. Associated symptoms comments: Also endorses a cold sore on her lower lip that started also 2 days ago.. The symptoms are aggravated by swallowing. Nothing relieves the symptoms. She has tried rest and acetaminophen for the symptoms. The treatment provided no relief.    Past Medical History:  Diagnosis Date  . Asthma   . Kidney disease   . Migraines   . Pneumonia   . Renal disorder     There are no active problems to display for this patient.   Past Surgical History:  Procedure Laterality Date  . FINGER SURGERY     ring finger left hand  . MOUTH SURGERY      OB History    Gravida Para Term Preterm AB Living   7 5 5   2 5    SAB TAB Ectopic Multiple Live Births   2               Home Medications    Prior to Admission medications   Medication Sig Start Date End Date Taking? Authorizing Provider  albuterol (PROVENTIL HFA;VENTOLIN HFA) 108 (90 BASE) MCG/ACT inhaler Inhale 2 puffs into the lungs every 6 (six) hours as needed for wheezing or shortness of breath.    Historical Provider, MD  magic mouthwash w/lidocaine SOLN Take 10 mLs by mouth 4 (four) times daily as needed (throat pain). May also apply to lip lesion for pain relief.  Note to pharmacy - equal parts diphendydramine, aluminum hydroxide and lidocaine HCL 01/18/16   Burgess AmorJulie Helvi Royals, PA-C    Family History No family history on file.  Social History Social History  Substance Use  Topics  . Smoking status: Current Every Day Smoker    Packs/day: 0.50    Years: 20.00    Types: Cigarettes  . Smokeless tobacco: Never Used  . Alcohol use No     Allergies   Bee venom; Ibuprofen; Augmentin [amoxicillin-pot clavulanate]; Elastic bandages & [zinc]; Gold-containing drug products; Grapefruit concentrate; Latex; Nickel; Other; Sulfa antibiotics; and Tramadol   Review of Systems Review of Systems  Constitutional: Negative for chills and fever.  HENT: Positive for rhinorrhea and sore throat. Negative for congestion, ear pain, sinus pressure, trouble swallowing and voice change.   Eyes: Negative for discharge.  Respiratory: Negative for cough, shortness of breath, wheezing and stridor.   Cardiovascular: Negative for chest pain.  Gastrointestinal: Negative for abdominal pain.  Genitourinary: Negative.   Neurological: Negative for headaches.     Physical Exam Updated Vital Signs BP 120/85 (BP Location: Right Arm)   Pulse 100   Temp 99.4 F (37.4 C) (Oral)   Resp 16   Ht 5' 2.5" (1.588 m)   Wt 49.9 kg   LMP  (LMP Unknown) Comment: stopped 6 months to 1 year ago  SpO2 98%   BMI 19.80 kg/m   Physical Exam  Constitutional: She is oriented to person, place, and time. She appears well-developed and  well-nourished.  HENT:  Head: Normocephalic and atraumatic.  Right Ear: Tympanic membrane and ear canal normal.  Left Ear: Tympanic membrane and ear canal normal.  Nose: No mucosal edema or rhinorrhea.  Mouth/Throat: Uvula is midline and mucous membranes are normal. Posterior oropharyngeal erythema present. No oropharyngeal exudate, posterior oropharyngeal edema or tonsillar abscesses. Tonsils are 2+ on the right. Tonsils are 2+ on the left. No tonsillar exudate.  Small herpes ulceration right lower lip.  Eyes: Conjunctivae are normal.  Cardiovascular: Normal rate and normal heart sounds.   Pulmonary/Chest: Effort normal. No respiratory distress. She has no wheezes. She  has no rales.  Abdominal: Soft. There is no tenderness.  Musculoskeletal: Normal range of motion.  Neurological: She is alert and oriented to person, place, and time.  Skin: Skin is warm and dry. No rash noted.  Psychiatric: She has a normal mood and affect.     ED Treatments / Results  Labs (all labs ordered are listed, but only abnormal results are displayed) Labs Reviewed  RAPID STREP SCREEN (NOT [facility])  CULTURE, GROUP A STREP [facility]    EKG  EKG Interpretation None       Radiology No results found.  Procedures Procedures (including critical care time)  Medications Ordered in ED Medications  albuterol (PROVENTIL HFA;VENTOLIN HFA) 108 (90 Base) MCG/ACT inhaler 2 puff (not administered)  lidocaine (XYLOCAINE) 2 % jelly 1 application (1 application Topical Given 01/17/16 2318)  acetaminophen (TYLENOL) tablet 650 mg (650 mg Oral Given 01/17/16 2317)     Initial Impression / Assessment and Plan / ED Course  I have reviewed the triage vital signs and the nursing notes.  Pertinent labs & imaging results that were available during my care of the patient were reviewed by me and considered in my medical decision making (see chart for details).  Clinical Course     Strep negative.  Advised tylenol, rest, increased fluid intake. Magic mouthwash for throat and for topical anesthetic of the cold sore.  F/u with pcp prn if sx persist or worsen.  Strep cx pending.  Final Clinical Impressions(s) / ED Diagnoses   Final diagnoses:  Viral pharyngitis    New Prescriptions New Prescriptions   MAGIC MOUTHWASH W/LIDOCAINE SOLN    Take 10 mLs by mouth 4 (four) times daily as needed (throat pain). May also apply to lip lesion for pain relief.  Note to pharmacy - equal parts diphendydramine, aluminum hydroxide and lidocaine HCL     Burgess AmorJulie Cindi Ghazarian, PA-C 01/18/16 1316    Linwood DibblesJon Knapp, MD 01/21/16 1029

## 2016-01-20 LAB — CULTURE, GROUP A STREP (THRC)

## 2016-03-12 ENCOUNTER — Emergency Department (HOSPITAL_COMMUNITY): Payer: Medicaid Other

## 2016-03-12 ENCOUNTER — Encounter (HOSPITAL_COMMUNITY): Payer: Self-pay | Admitting: Cardiology

## 2016-03-12 ENCOUNTER — Inpatient Hospital Stay (HOSPITAL_COMMUNITY)
Admission: EM | Admit: 2016-03-12 | Discharge: 2016-03-16 | DRG: 194 | Disposition: A | Discharge status: Home / Self Care | Payer: Medicaid Other | Attending: Internal Medicine | Admitting: Internal Medicine

## 2016-03-12 DIAGNOSIS — F17210 Nicotine dependence, cigarettes, uncomplicated: Secondary | ICD-10-CM | POA: Diagnosis present

## 2016-03-12 DIAGNOSIS — L03113 Cellulitis of right upper limb: Secondary | ICD-10-CM | POA: Diagnosis present

## 2016-03-12 DIAGNOSIS — F1410 Cocaine abuse, uncomplicated: Secondary | ICD-10-CM | POA: Diagnosis present

## 2016-03-12 DIAGNOSIS — F419 Anxiety disorder, unspecified: Secondary | ICD-10-CM | POA: Diagnosis present

## 2016-03-12 DIAGNOSIS — D709 Neutropenia, unspecified: Secondary | ICD-10-CM | POA: Diagnosis present

## 2016-03-12 DIAGNOSIS — L0390 Cellulitis, unspecified: Secondary | ICD-10-CM | POA: Diagnosis present

## 2016-03-12 DIAGNOSIS — Z888 Allergy status to other drugs, medicaments and biological substances status: Secondary | ICD-10-CM

## 2016-03-12 DIAGNOSIS — Z885 Allergy status to narcotic agent status: Secondary | ICD-10-CM | POA: Diagnosis not present

## 2016-03-12 DIAGNOSIS — Z91040 Latex allergy status: Secondary | ICD-10-CM | POA: Diagnosis not present

## 2016-03-12 DIAGNOSIS — Z886 Allergy status to analgesic agent status: Secondary | ICD-10-CM

## 2016-03-12 DIAGNOSIS — J45909 Unspecified asthma, uncomplicated: Secondary | ICD-10-CM | POA: Diagnosis present

## 2016-03-12 DIAGNOSIS — R4182 Altered mental status, unspecified: Secondary | ICD-10-CM | POA: Diagnosis present

## 2016-03-12 DIAGNOSIS — R509 Fever, unspecified: Secondary | ICD-10-CM

## 2016-03-12 DIAGNOSIS — Z91030 Bee allergy status: Secondary | ICD-10-CM

## 2016-03-12 DIAGNOSIS — J181 Lobar pneumonia, unspecified organism: Secondary | ICD-10-CM

## 2016-03-12 DIAGNOSIS — Z880 Allergy status to penicillin: Secondary | ICD-10-CM | POA: Diagnosis not present

## 2016-03-12 DIAGNOSIS — J189 Pneumonia, unspecified organism: Principal | ICD-10-CM | POA: Diagnosis present

## 2016-03-12 DIAGNOSIS — Z91048 Other nonmedicinal substance allergy status: Secondary | ICD-10-CM | POA: Diagnosis not present

## 2016-03-12 DIAGNOSIS — Z23 Encounter for immunization: Secondary | ICD-10-CM | POA: Diagnosis not present

## 2016-03-12 DIAGNOSIS — F1010 Alcohol abuse, uncomplicated: Secondary | ICD-10-CM | POA: Diagnosis present

## 2016-03-12 DIAGNOSIS — Z882 Allergy status to sulfonamides status: Secondary | ICD-10-CM | POA: Diagnosis not present

## 2016-03-12 DIAGNOSIS — Z720 Tobacco use: Secondary | ICD-10-CM | POA: Diagnosis present

## 2016-03-12 HISTORY — DX: Other chronic pain: G89.29

## 2016-03-12 HISTORY — DX: Bipolar disorder, unspecified: F31.9

## 2016-03-12 HISTORY — DX: Cocaine abuse, uncomplicated: F14.10

## 2016-03-12 HISTORY — DX: Dorsalgia, unspecified: M54.9

## 2016-03-12 LAB — COMPREHENSIVE METABOLIC PANEL
ALT: 14 U/L (ref 14–54)
AST: 22 U/L (ref 15–41)
Albumin: 3.4 g/dL — ABNORMAL LOW (ref 3.5–5.0)
Alkaline Phosphatase: 52 U/L (ref 38–126)
Anion gap: 11 (ref 5–15)
BUN: 10 mg/dL (ref 6–20)
CO2: 25 mmol/L (ref 22–32)
Calcium: 8.8 mg/dL — ABNORMAL LOW (ref 8.9–10.3)
Chloride: 95 mmol/L — ABNORMAL LOW (ref 101–111)
Creatinine, Ser: 0.67 mg/dL (ref 0.44–1.00)
GFR calc Af Amer: >60 mL/min (ref >60)
GFR calc non Af Amer: >60 mL/min (ref >60)
Glucose, Bld: 109 mg/dL — ABNORMAL HIGH (ref 65–99)
Potassium: 3.5 mmol/L (ref 3.5–5.1)
Sodium: 131 mmol/L — ABNORMAL LOW (ref 135–145)
Total Bilirubin: 0.7 mg/dL (ref 0.3–1.2)
Total Protein: 7.8 g/dL (ref 6.5–8.1)

## 2016-03-12 LAB — URINALYSIS, ROUTINE W REFLEX MICROSCOPIC
Bacteria, UA: NONE SEEN
Bilirubin Urine: NEGATIVE
Glucose, UA: NEGATIVE mg/dL
Ketones, ur: NEGATIVE mg/dL
Leukocytes, UA: NEGATIVE
Nitrite: NEGATIVE
Protein, ur: NEGATIVE mg/dL
Specific Gravity, Urine: 1.008 (ref 1.005–1.030)
pH: 6 (ref 5.0–8.0)

## 2016-03-12 LAB — CBC WITH DIFFERENTIAL/PLATELET
Basophils Absolute: 0 10*3/uL (ref 0.0–0.1)
Basophils Relative: 1 %
Eosinophils Absolute: 0 10*3/uL (ref 0.0–0.7)
Eosinophils Relative: 1 %
HCT: 33.4 % — ABNORMAL LOW (ref 36.0–46.0)
Hemoglobin: 11.8 g/dL — ABNORMAL LOW (ref 12.0–15.0)
Lymphocytes Relative: 49 %
Lymphs Abs: 0.9 10*3/uL (ref 0.7–4.0)
MCH: 28 pg (ref 26.0–34.0)
MCHC: 35.3 g/dL (ref 30.0–36.0)
MCV: 79.3 fL (ref 78.0–100.0)
Monocytes Absolute: 0.9 10*3/uL (ref 0.1–1.0)
Monocytes Relative: 49 %
Neutro Abs: 0 10*3/uL — ABNORMAL LOW (ref 1.7–7.7)
Neutrophils Relative %: 1 %
Platelets: 330 10*3/uL (ref 150–400)
RBC: 4.21 MIL/uL (ref 3.87–5.11)
RDW: 12.2 % (ref 11.5–15.5)
WBC: 1.9 10*3/uL — ABNORMAL LOW (ref 4.0–10.5)
nRBC: 0 /100 WBC

## 2016-03-12 LAB — I-STAT BETA HCG BLOOD, ED (MC, WL, AP ONLY): I-stat hCG, quantitative: <5 m[IU]/mL (ref <5)

## 2016-03-12 LAB — LACTIC ACID, PLASMA: Lactic Acid, Venous: 1.4 mmol/L (ref 0.5–1.9)

## 2016-03-12 LAB — INFLUENZA PANEL BY PCR (TYPE A & B)
Influenza A By PCR: NEGATIVE
Influenza B By PCR: NEGATIVE

## 2016-03-12 LAB — ETHANOL: Alcohol, Ethyl (B): 5 mg/dL — ABNORMAL HIGH (ref <5)

## 2016-03-12 LAB — ACETAMINOPHEN LEVEL: Acetaminophen (Tylenol), Serum: <10 ug/mL — ABNORMAL LOW (ref 10–30)

## 2016-03-12 LAB — RAPID URINE DRUG SCREEN, HOSP PERFORMED
Amphetamines: NOT DETECTED
Barbiturates: NOT DETECTED
Benzodiazepines: NOT DETECTED
Cocaine: POSITIVE — AB
Opiates: NOT DETECTED
Tetrahydrocannabinol: NOT DETECTED

## 2016-03-12 LAB — SALICYLATE LEVEL: Salicylate Lvl: <7 mg/dL (ref 2.8–30.0)

## 2016-03-12 LAB — PREGNANCY, URINE: Preg Test, Ur: NEGATIVE

## 2016-03-12 MED ORDER — IPRATROPIUM-ALBUTEROL 0.5-2.5 (3) MG/3ML IN SOLN
3.0000 mL | Freq: Four times a day (QID) | RESPIRATORY_TRACT | Status: DC
  Administered 2016-03-13 – 2016-03-15 (×12): 3 mL via RESPIRATORY_TRACT
  Filled 2016-03-12 (×12): qty 3

## 2016-03-12 MED ORDER — FOLIC ACID 1 MG PO TABS
1.0000 mg | ORAL_TABLET | Freq: Every day | ORAL | Status: DC
  Administered 2016-03-13 – 2016-03-16 (×3): 1 mg via ORAL
  Filled 2016-03-12 (×4): qty 1

## 2016-03-12 MED ORDER — LORAZEPAM 2 MG/ML IJ SOLN
0.0000 mg | Freq: Four times a day (QID) | INTRAMUSCULAR | Status: DC
  Administered 2016-03-13: 1 mg via INTRAVENOUS
  Administered 2016-03-13 – 2016-03-14 (×2): 2 mg via INTRAVENOUS
  Filled 2016-03-12 (×5): qty 1

## 2016-03-12 MED ORDER — THIAMINE HCL 100 MG/ML IJ SOLN: 100.0000 mg | Freq: Every day | INTRAMUSCULAR | Status: DC

## 2016-03-12 MED ORDER — SODIUM CHLORIDE 0.9 % IV BOLUS (SEPSIS)
1000.0000 mL | Freq: Once | INTRAVENOUS | Status: AC
  Administered 2016-03-12: 1000 mL via INTRAVENOUS

## 2016-03-12 MED ORDER — DEXTROSE 5 % IV SOLN
500.0000 mg | INTRAVENOUS | Status: DC
  Administered 2016-03-13 – 2016-03-15 (×4): 500 mg via INTRAVENOUS
  Filled 2016-03-12 (×5): qty 500

## 2016-03-12 MED ORDER — GUAIFENESIN ER 600 MG PO TB12
1200.0000 mg | ORAL_TABLET | Freq: Two times a day (BID) | ORAL | Status: DC
  Administered 2016-03-13 – 2016-03-14 (×5): 1200 mg via ORAL
  Filled 2016-03-12 (×5): qty 2

## 2016-03-12 MED ORDER — ALBUTEROL SULFATE (2.5 MG/3ML) 0.083% IN NEBU
2.5000 mg | INHALATION_SOLUTION | RESPIRATORY_TRACT | Status: DC | PRN
  Administered 2016-03-14 – 2016-03-15 (×2): 2.5 mg via RESPIRATORY_TRACT
  Filled 2016-03-12 (×2): qty 3

## 2016-03-12 MED ORDER — DOXYCYCLINE HYCLATE 100 MG IV SOLR
100.0000 mg | Freq: Once | INTRAVENOUS | Status: AC
  Administered 2016-03-12: 100 mg via INTRAVENOUS
  Filled 2016-03-12: qty 100

## 2016-03-12 MED ORDER — VITAMIN B-1 100 MG PO TABS
100.0000 mg | ORAL_TABLET | Freq: Every day | ORAL | Status: DC
  Administered 2016-03-13 – 2016-03-16 (×3): 100 mg via ORAL
  Filled 2016-03-12 (×4): qty 1

## 2016-03-12 MED ORDER — SODIUM CHLORIDE 0.9 % IV SOLN
INTRAVENOUS | Status: DC
  Administered 2016-03-12 – 2016-03-16 (×7): via INTRAVENOUS

## 2016-03-12 MED ORDER — ACETAMINOPHEN 500 MG PO TABS
1000.0000 mg | ORAL_TABLET | Freq: Once | ORAL | Status: AC
  Administered 2016-03-12: 1000 mg via ORAL
  Filled 2016-03-12: qty 2

## 2016-03-12 MED ORDER — CLINDAMYCIN PHOSPHATE 900 MG/50ML IV SOLN: 900.0000 mg | Freq: Once | INTRAVENOUS | Status: DC

## 2016-03-12 MED ORDER — DEXTROSE 5 % IV SOLN
2.0000 g | Freq: Once | INTRAVENOUS | Status: AC
  Administered 2016-03-12: 2 g via INTRAVENOUS
  Filled 2016-03-12: qty 2

## 2016-03-12 MED ORDER — LORAZEPAM 2 MG/ML IJ SOLN
2.0000 mg | Freq: Once | INTRAMUSCULAR | Status: AC
  Administered 2016-03-12: 2 mg via INTRAVENOUS
  Filled 2016-03-12: qty 1

## 2016-03-12 MED ORDER — LORAZEPAM 1 MG PO TABS: 1.0000 mg | ORAL_TABLET | Freq: Four times a day (QID) | ORAL | Status: AC | PRN

## 2016-03-12 MED ORDER — DEXTROSE 5 % IV SOLN
2.0000 g | INTRAVENOUS | Status: DC
  Administered 2016-03-13 – 2016-03-16 (×4): 2 g via INTRAVENOUS
  Filled 2016-03-12 (×4): qty 2

## 2016-03-12 MED ORDER — DEXTROSE 5 % IV SOLN: 1.0000 g | INTRAVENOUS | Status: DC

## 2016-03-12 MED ORDER — LORAZEPAM 2 MG/ML IJ SOLN
1.0000 mg | Freq: Four times a day (QID) | INTRAMUSCULAR | Status: AC | PRN
  Administered 2016-03-13 (×2): 1 mg via INTRAVENOUS
  Filled 2016-03-12: qty 1

## 2016-03-12 MED ORDER — IPRATROPIUM-ALBUTEROL 0.5-2.5 (3) MG/3ML IN SOLN
3.0000 mL | Freq: Once | RESPIRATORY_TRACT | Status: AC
  Administered 2016-03-12: 3 mL via RESPIRATORY_TRACT
  Filled 2016-03-12: qty 3

## 2016-03-12 MED ORDER — LORAZEPAM 2 MG/ML IJ SOLN
0.0000 mg | Freq: Two times a day (BID) | INTRAMUSCULAR | Status: DC
Start: 2016-03-15 — End: 2016-03-14

## 2016-03-12 MED ORDER — ENOXAPARIN SODIUM 40 MG/0.4ML ~~LOC~~ SOLN
40.0000 mg | SUBCUTANEOUS | Status: DC
  Administered 2016-03-13: 40 mg via SUBCUTANEOUS
  Filled 2016-03-12: qty 0.4

## 2016-03-12 MED ORDER — TETANUS-DIPHTH-ACELL PERTUSSIS 5-2.5-18.5 LF-MCG/0.5 IM SUSP
0.5000 mL | Freq: Once | INTRAMUSCULAR | Status: AC
  Administered 2016-03-12: 0.5 mL via INTRAMUSCULAR
  Filled 2016-03-12: qty 0.5

## 2016-03-12 MED ORDER — HYDROCODONE-HOMATROPINE 5-1.5 MG/5ML PO SYRP
5.0000 mL | ORAL_SOLUTION | Freq: Four times a day (QID) | ORAL | Status: DC | PRN
  Filled 2016-03-12: qty 5

## 2016-03-12 NOTE — ED Notes (Signed)
[facility] with 2 person assist.  Unable to urinate.

## 2016-03-12 NOTE — ED Notes (Signed)
Pt sitting up with son at bedside.  No distress.

## 2016-03-12 NOTE — Progress Notes (Signed)
Pharmacy Antibiotic Note  Christine Vargas is a 41 y.o. female admitted on 03/12/2016 with pneumonia.  Pharmacy has been consulted for ROCEPHIN dosing.  Plan: Rocephin 2gm IV q24hrs Monitor labs, progress, c/s     Temp (24hrs), Avg:101.3 F (38.5 C), Min:99.5 F (37.5 C), Max:103.1 F (39.5 C)   Recent Labs Lab 03/12/16 0917  WBC 1.9*  CREATININE 0.67  LATICACIDVEN 1.4    CrCl cannot be calculated (Unknown ideal weight.).    Allergies  Allergen Reactions  . Bee Venom Anaphylaxis  . Ibuprofen Other (See Comments)    REACTION: advised by MD to stop due to kidney health  . Augmentin [Amoxicillin-Pot Clavulanate] Rash  . Elastic Bandages & [Zinc] Rash  . Gold-Containing Drug Products Rash  . Grapefruit Concentrate Rash  . Latex Rash and Other (See Comments)    Redness, warm to touch  . Nickel Rash  . Other Rash    SYNTHETIC PLASTIC  . Sulfa Antibiotics Rash  . Tramadol Rash    BRAND NAME: Ultram    Antimicrobials this admission: Rocephin 2/7 >>   Dose adjustments this admission:  Microbiology results:  BCx: pending  UCx: pending   Sputum:    MRSA PCR:   Thank you for allowing pharmacy to be a part of this patient's care.  Valrie HartHall, Lewanda Perea A 03/12/2016 3:17 PM

## 2016-03-12 NOTE — ED Notes (Signed)
Asked pt to provide a urine sample before x-rays can be taken. Pt refused stating she is leaving and doesn't want anything done here at this hospital.

## 2016-03-12 NOTE — H&P (Signed)
History and Physical    Christine Vargas RUE:[id number] DOB: 1975-08-01 DOA: 03/12/2016  PCP: Colette Ribas, MD  Patient coming from: home  Chief Complaint: cough  HPI: Christine Vargas is a 41 y.o. female with medical history significant of polysubstance abuse (Tobacco, ETOH, cocaine) presents with cough and pain in her right arm. Patient reports having a cough and shortness of breath for several days now. She is lethargic at this time and unable to adequately participate in history. History is obtained from medical record. She has been having fever, cough, postnasal drip. She reports hitting her right arm against a metal bench at home and has had redness over right arm which has progressively gotten worse. When she arrived in the ER she was noted to be confused, anxious and was refusing care. After family arrived she agreed to allow further work up/treatment  ED Course: chest xray indicated possible developing pneumonia. She was noted to have neutropenia. Influenza and rapid strep was negative.  Review of Systems: unable to assess due to mental status  Past Medical History:  Diagnosis Date  . Asthma   . Bipolar disorder (HCC)   . Chronic back pain   . Cocaine abuse   . Kidney disease   . Migraines   . Pneumonia   . Renal disorder     Past Surgical History:  Procedure Laterality Date  . FINGER SURGERY     ring finger left hand  . MOUTH SURGERY       reports that she has been smoking Cigarettes.  She has a 10.00 pack-year smoking history. She has never used smokeless tobacco. She reports that she uses drugs, including Cocaine. She reports that she does not drink alcohol.  Allergies  Allergen Reactions  . Bee Venom Anaphylaxis  . Ibuprofen Other (See Comments)    REACTION: advised by MD to stop due to kidney health  . Augmentin [Amoxicillin-Pot Clavulanate] Rash  . Elastic Bandages & [Zinc] Rash  . Gold-Containing Drug Products Rash  . Grapefruit Concentrate  Rash  . Latex Rash and Other (See Comments)    Redness, warm to touch  . Nickel Rash  . Other Rash    SYNTHETIC PLASTIC  . Sulfa Antibiotics Rash  . Tramadol Rash    BRAND NAME: Ultram    Family History: Family history reviewed and not pertinent   Prior to Admission medications   Medication Sig Start Date End Date Taking? Authorizing Provider  albuterol (PROVENTIL HFA;VENTOLIN HFA) 108 (90 BASE) MCG/ACT inhaler Inhale 2 puffs into the lungs every 6 (six) hours as needed for wheezing or shortness of breath.   Yes Historical Provider, MD  magic mouthwash w/lidocaine SOLN Take 10 mLs by mouth 4 (four) times daily as needed (throat pain). May also apply to lip lesion for pain relief.  Note to pharmacy - equal parts diphendydramine, aluminum hydroxide and lidocaine HCL Patient not taking: Reported on 03/12/2016 01/18/16   Burgess Amor, PA-C    Physical Exam: Vitals:   03/12/16 1200 03/12/16 1533 03/12/16 1535 03/12/16 1537  BP: 109/69 (!) 85/68    Pulse: 110 111    Resp: (!) 29 22    Temp:   98.8 F (37.1 C)   TempSrc:   Oral   SpO2: 98% 95%    Weight:    43.5 kg (96 lb)  Height:    5' 2.5" (1.588 m)      Constitutional: NAD, calm, comfortable Vitals:   03/12/16 1200 03/12/16 1533 03/12/16 1535  03/12/16 1537  BP: 109/69 (!) 85/68    Pulse: 110 111    Resp: (!) 29 22    Temp:   98.8 F (37.1 C)   TempSrc:   Oral   SpO2: 98% 95%    Weight:    43.5 kg (96 lb)  Height:    5' 2.5" (1.588 m)   Eyes: PERRL, lids and conjunctivae normal ENMT: Mucous membranes are moist. Posterior pharynx clear of any exudate or lesions.Normal dentition.  Neck: normal, supple, no masses, no thyromegaly Respiratory: clear to auscultation bilaterally, no wheezing, no crackles. Normal respiratory effort. No accessory muscle use.  Cardiovascular: Regular rate and rhythm, no murmurs / rubs / gallops. No extremity edema. 2+ pedal pulses. No carotid bruits.  Abdomen: no tenderness, no masses palpated. No  hepatosplenomegaly. Bowel sounds positive.  Musculoskeletal: no clubbing / cyanosis. No joint deformity upper and lower extremities. Good ROM, no contractures. Normal muscle tone.  Skin: erythema from right wrist, streaking up forearm, no fluctuance Neurologic: CN 2-12 grossly intact. Sensation intact, DTR normal. Strength 5/5 in all 4.  Psychiatric: somnolent  Labs on Admission: I have personally reviewed following labs and imaging studies  CBC:  Recent Labs Lab 03/12/16 0917  WBC 1.9*  NEUTROABS 0.0*  HGB 11.8*  HCT 33.4*  MCV 79.3  PLT 330   Basic Metabolic Panel:  Recent Labs Lab 03/12/16 0917  NA 131*  K 3.5  CL 95*  CO2 25  GLUCOSE 109*  BUN 10  CREATININE 0.67  CALCIUM 8.8*   GFR: Estimated Creatinine Clearance: 63.6 mL/min (by C-G formula based on SCr of 0.67 mg/dL). Liver Function Tests:  Recent Labs Lab 03/12/16 0917  AST 22  ALT 14  ALKPHOS 52  BILITOT 0.7  PROT 7.8  ALBUMIN 3.4*   No results for input(s): LIPASE, AMYLASE in the last 168 hours. No results for input(s): AMMONIA in the last 168 hours. Coagulation Profile: No results for input(s): INR, PROTIME in the last 168 hours. Cardiac Enzymes: No results for input(s): CKTOTAL, CKMB, CKMBINDEX, TROPONINI in the last 168 hours. BNP (last 3 results) No results for input(s): PROBNP in the last 8760 hours. HbA1C: No results for input(s): HGBA1C in the last 72 hours. CBG: No results for input(s): GLUCAP in the last 168 hours. Lipid Profile: No results for input(s): CHOL, HDL, LDLCALC, TRIG, CHOLHDL, LDLDIRECT in the last 72 hours. Thyroid Function Tests: No results for input(s): TSH, T4TOTAL, FREET4, T3FREE, THYROIDAB in the last 72 hours. Anemia Panel: No results for input(s): VITAMINB12, FOLATE, FERRITIN, TIBC, IRON, RETICCTPCT in the last 72 hours. Urine analysis:    Component Value Date/Time   COLORURINE YELLOW 03/12/2016 0923   APPEARANCEUR CLEAR 03/12/2016 0923   LABSPEC 1.008  03/12/2016 0923   PHURINE 6.0 03/12/2016 0923   GLUCOSEU NEGATIVE 03/12/2016 0923   HGBUR SMALL (A) 03/12/2016 0923   BILIRUBINUR NEGATIVE 03/12/2016 0923   KETONESUR NEGATIVE 03/12/2016 0923   PROTEINUR NEGATIVE 03/12/2016 0923   UROBILINOGEN 0.2 06/05/2014 1341   NITRITE NEGATIVE 03/12/2016 0923   LEUKOCYTESUR NEGATIVE 03/12/2016 0923   Sepsis Labs: !!!!!!!!!!!!!!!!!!!!!!!!!!!!!!!!!!!!!!!!!!!! @LABRCNTIP (procalcitonin:4,lacticidven:4) ) Recent Results (from the past 240 hour(s))  Culture, blood (routine x 2)     Status: None (Preliminary result)   Collection Time: 03/12/16  9:17 AM  Result Value Ref Range Status   Specimen Description BLOOD RIGHT FOREARM DRAWN BY RN  Final   Special Requests BOTTLES DRAWN AEROBIC AND ANAEROBIC 8CC  Final   Culture PENDING  Incomplete  Report Status PENDING  Incomplete  Culture, blood (routine x 2)     Status: None (Preliminary result)   Collection Time: 03/12/16  9:26 AM  Result Value Ref Range Status   Specimen Description BLOOD LEFT ARM  Final   Special Requests BOTTLES DRAWN AEROBIC AND ANAEROBIC 8CC  Final   Culture PENDING  Incomplete   Report Status PENDING  Incomplete     Radiological Exams on Admission: Dg Chest 2 View  Result Date: 03/12/2016 CLINICAL DATA:  Altered mental status. Pt is complaining with difficulty breathing and chest pain. She has runny nose and says she has to lay flat. She has a fever. She said that she had been sick about 1 month ago and got a little better and th.*comment was truncated* EXAM: CHEST  2 VIEW COMPARISON:  08/23/2012 FINDINGS: Mild hyperinflation. Midline trachea. Normal heart size and mediastinal contours. No pleural effusion or pneumothorax. Biapical pleural thickening. Moderate lower lobe predominant interstitial thickening. Somewhat more focal patchy airspace disease is suspected at the right lung base, especially laterally. Probable bilateral nipple shadows. Numerous leads and wires project over the  chest. IMPRESSION: Suspicion of right lower lobe pneumonia. Followup PA and lateral chest X-ray is recommended in 3-4 weeks following trial of antibiotic therapy to ensure resolution and exclude underlying malignancy. Moderate lower lobe predominant interstitial thickening may relate to chronic bronchitis/ smoking. Superimposed acute/subacute bronchitis cannot be excluded. Probable bilateral nipple shadows. Repeat frontal radiograph with nipple markers could confirm. Electronically Signed   By: Jeronimo Greaves M.D.   On: 03/12/2016 10:32   Dg Wrist Complete Right  Result Date: 03/12/2016 CLINICAL DATA:  Some type of sore/abrsion on anterior side of wrist on ulnar side at base of palm with red streaking up to elbow. Pt has altered mental status and couldn't really tell me what happened or how long, just that it hurts and she has a fever. EXAM: RIGHT WRIST - COMPLETE 3+ VIEW COMPARISON:  None. FINDINGS: There is no evidence of fracture or dislocation. There is no evidence of arthropathy or other focal bone abnormality. No radiodense foreign body or subcutaneous gas. Soft tissues are unremarkable. IMPRESSION: Negative. Electronically Signed   By: Corlis Leak M.D.   On: 03/12/2016 10:30    EKG: Independently reviewed. Sinus tachycardia  Assessment/Plan Active Problems:   CAP (community acquired pneumonia)   Cellulitis   Cocaine abuse   Neutropenia (HCC)   Anxiety   Tobacco abuse   Alcohol abuse   1. Cellulitis of right forearm. Started the patient on ceftriaxone. Check HIV status. She received tetanus shot in ED.  2. CAP. Started on ceftriaxone and azithromycin. Was febrile in ED. Blood cultures and urine cultures have been ordered. Lactic acid normal.   3. Neutropenia. Possibly related to infection. Recheck in AM. Check HIV  4. History of alcohol abuse. Possible history of alcohol abuse. alcohol level is negative in ED. ?withdrawal. She is very anxious. Will start on CIWA protocol  5. Cocaine  abuse. Will request social work consult    DVT prophylaxis: lovenox Code Status: full code Family Communication: discussed with son at the bedside Disposition Plan: discharge home once improved Consults called:  Admission status: inpatient, tele   Deondra Wigger MD [facility] Pager [phone]  If 7PM-7AM, please contact night-coverage [email or web address] Password [email or web address]  03/12/2016, 5:02 PM

## 2016-03-12 NOTE — Progress Notes (Signed)
Pts temperature is 102.9, no orders for Tylenol. Mid-level paged. Waiting for orders.

## 2016-03-12 NOTE — ED Notes (Signed)
Pt is very agitated and restless.  Repeatedly says, "he's been smoking receipts and I can't breath."  When asked what receipts is, she says, "Receipts".  Pt has red streak running up right arm, from inner wrist to elbow.  Area is tender to touch.  Says she hit are wrist on metal bench in her basement.  Mother is in room and says that her friend, who lives with her is smoking receipts from [employer] and [employer] with tobacco.  Pt denies HI/SI.  Pt has been threshing in the bed but has settled settled down allowing treatment.

## 2016-03-12 NOTE — ED Triage Notes (Signed)
Pt acting out of control in triage.  Crying and talking out of her head.  Pt holding pill bottle in her hand and states it is her Moms and she took 2 of them.  Right arm red and states her arm is hurting and she can't breathe.

## 2016-03-12 NOTE — ED Provider Notes (Signed)
[facility]-EMERGENCY DEPT Provider Note   CSN: [id number] Arrival date & time: 03/12/16  4098     History   Chief Complaint Chief Complaint  Patient presents with  . Altered Mental Status    HPI Phoebie Shad Mccorkel is a 41 y.o. female.  The history is provided by the patient. The history is limited by the condition of the patient (poor historian).  Altered Mental Status    Pt was seen at 0910.  Pt presents to ED holding a pill bottle in her hand, states it is her mother's prednisone. States she "took 2 of them." Pt states she also "has some cocaine in her system." Pt states her "arm is red," "mucus is running down the back of my throat so I can't breathe" and she has been "couging for the past month."  States the redness in her arm began after she "hit her arm against a metal bench in the basement" several days ago.    Past Medical History:  Diagnosis Date  . Asthma   . Bipolar disorder (HCC)   . Chronic back pain   . Cocaine abuse   . Kidney disease   . Migraines   . Pneumonia   . Renal disorder     There are no active problems to display for this patient.   Past Surgical History:  Procedure Laterality Date  . FINGER SURGERY     ring finger left hand  . MOUTH SURGERY      OB History    Gravida Para Term Preterm AB Living   7 5 5   2 5    SAB TAB Ectopic Multiple Live Births   2               Home Medications    Prior to Admission medications   Medication Sig Start Date End Date Taking? Authorizing Provider  albuterol (PROVENTIL HFA;VENTOLIN HFA) 108 (90 BASE) MCG/ACT inhaler Inhale 2 puffs into the lungs every 6 (six) hours as needed for wheezing or shortness of breath.    Historical Provider, MD  magic mouthwash w/lidocaine SOLN Take 10 mLs by mouth 4 (four) times daily as needed (throat pain). May also apply to lip lesion for pain relief.  Note to pharmacy - equal parts diphendydramine, aluminum hydroxide and lidocaine HCL 01/18/16   Burgess Amor, PA-C     Family History History reviewed. No pertinent family history.  Social History Social History  Substance Use Topics  . Smoking status: Current Every Day Smoker    Packs/day: 0.50    Years: 20.00    Types: Cigarettes  . Smokeless tobacco: Never Used  . Alcohol use No     Allergies   Bee venom; Ibuprofen; Augmentin [amoxicillin-pot clavulanate]; Elastic bandages & [zinc]; Gold-containing drug products; Grapefruit concentrate; Latex; Nickel; Other; Sulfa antibiotics; and Tramadol   Review of Systems Review of Systems  Unable to perform ROS: Other     Physical Exam Updated Vital Signs BP (!) 145/111   Pulse (!) 127   Temp (!) 103.1 F (39.5 C) (Oral)   Resp (!) 28   SpO2 94%   Physical Exam 0915: Physical examination:  Nursing notes reviewed; Vital signs and O2 SAT reviewed; +febrile.;; Constitutional: Well developed, Well nourished, Well hydrated, Yelling and thrashing around on stretcher.;; Head:  Normocephalic, atraumatic; Eyes: EOMI, PERRL, No scleral icterus; ENMT: +clear rhinorrhea. Mouth and pharynx normal, Mucous membranes moist; Neck: Supple, Full range of motion, No meningeal signs.; Cardiovascular: Tachycardic rate and rhythm, No gallop;  Respiratory: Breath sounds coarse & equal bilaterally, No wheezes.  Speaking full sentences with ease, Normal respiratory effort/excursion; Chest: Nontender, Movement normal; Abdomen: Soft, Nontender, Nondistended, Normal bowel sounds; Genitourinary: No CVA tenderness; Spine:  No midline CS, TS, LS tenderness.;; Extremities: Pulses normal, +right volar medial wrist with localized scab, tenderness, erythema with streaking up volar forearm. No fluctuance. No deformity. No edema, FROM right hand/wrist/elbow. Right forearm muscles compartments soft..; Neuro: Awake, alert, rambling historian. Major CN grossly intact. No facial droop.  Speech clear. No gross focal motor or sensory deficits in extremities.; Skin: Color normal, Warm, Dry.      ED Treatments / Results  Labs (all labs ordered are listed, but only abnormal results are displayed)   EKG  EKG Interpretation  Date/Time:  Wednesday March 12 2016 09:10:17 EST Ventricular Rate:  128 PR Interval:    QRS Duration: 114 QT Interval:  317 QTC Calculation: 459 R Axis:   70 Text Interpretation:  Sinus tachycardia Borderline intraventricular conduction delay RSR' in V1 or V2, right VCD or RVH Baseline wander Artifact When compared with ECG of 04/01/2012 Rate faster Confirmed by [facility]  MD, Nicholos Johns [phone]) on 03/12/2016 9:33:27 AM       Radiology   Procedures Procedures (including critical care time)  Medications Ordered in ED Medications  LORazepam (ATIVAN) injection 2 mg (not administered)     Initial Impression / Assessment and Plan / ED Course  I have reviewed the triage vital signs and the nursing notes.  Pertinent labs & imaging results that were available during my care of the patient were reviewed by me and considered in my medical decision making (see chart for details).  MDM Reviewed: previous chart, nursing note and vitals Reviewed previous: labs and ECG Interpretation: labs, ECG and x-ray   Results for orders placed or performed during the hospital encounter of 03/12/16  Culture, blood (routine x 2)  Result Value Ref Range   Specimen Description BLOOD RIGHT FOREARM DRAWN BY RN    Special Requests BOTTLES DRAWN AEROBIC AND ANAEROBIC 8CC    Culture PENDING    Report Status PENDING   Culture, blood (routine x 2)  Result Value Ref Range   Specimen Description BLOOD LEFT ARM    Special Requests BOTTLES DRAWN AEROBIC AND ANAEROBIC 8CC    Culture PENDING    Report Status PENDING   Comprehensive metabolic panel  Result Value Ref Range   Sodium 131 (L) 135 - 145 mmol/L   Potassium 3.5 3.5 - 5.1 mmol/L   Chloride 95 (L) 101 - 111 mmol/L   CO2 25 22 - 32 mmol/L   Glucose, Bld 109 (H) 65 - 99 mg/dL   BUN 10 6 - 20 mg/dL   Creatinine,  Ser 6.04 0.44 - 1.00 mg/dL   Calcium 8.8 (L) 8.9 - 10.3 mg/dL   Total Protein 7.8 6.5 - 8.1 g/dL   Albumin 3.4 (L) 3.5 - 5.0 g/dL   AST 22 15 - 41 U/L   ALT 14 14 - 54 U/L   Alkaline Phosphatase 52 38 - 126 U/L   Total Bilirubin 0.7 0.3 - 1.2 mg/dL   GFR calc non Af Amer >60 >60 mL/min   GFR calc Af Amer >60 >60 mL/min   Anion gap 11 5 - 15  Acetaminophen level  Result Value Ref Range   Acetaminophen (Tylenol), Serum <10 (L) 10 - 30 ug/mL  Ethanol  Result Value Ref Range   Alcohol, Ethyl (B) 5 (H) <5 mg/dL  Salicylate  level  Result Value Ref Range   Salicylate Lvl <7.0 2.8 - 30.0 mg/dL  Lactic acid, plasma  Result Value Ref Range   Lactic Acid, Venous 1.4 0.5 - 1.9 mmol/L  CBC with Differential  Result Value Ref Range   WBC 1.9 (L) 4.0 - 10.5 K/uL   RBC 4.21 3.87 - 5.11 MIL/uL   Hemoglobin 11.8 (L) 12.0 - 15.0 g/dL   HCT 16.1 (L) 09.6 - 04.5 %   MCV 79.3 78.0 - 100.0 fL   MCH 28.0 26.0 - 34.0 pg   MCHC 35.3 30.0 - 36.0 g/dL   RDW 40.9 81.1 - 91.4 %   Platelets 330 150 - 400 K/uL   Neutrophils Relative % 1 %   Neutro Abs 0.0 (L) 1.7 - 7.7 K/uL   Lymphocytes Relative 49 %   Lymphs Abs 0.9 0.7 - 4.0 K/uL   Monocytes Relative 49 %   Monocytes Absolute 0.9 0.1 - 1.0 K/uL   Eosinophils Relative 1 %   Eosinophils Absolute 0.0 0.0 - 0.7 K/uL   Basophils Relative 1 %   Basophils Absolute 0.0 0.0 - 0.1 K/uL   WBC Morphology WHITE COUNT CONFIRMED ON SMEAR    nRBC 0 0 /100 WBC  I-Stat beta hCG blood, ED  Result Value Ref Range   I-stat hCG, quantitative <5.0 <5 mIU/mL   Comment 3           Dg Chest 2 View Result Date: 03/12/2016 CLINICAL DATA:  Altered mental status. Pt is complaining with difficulty breathing and chest pain. She has runny nose and says she has to lay flat. She has a fever. She said that she had been sick about 1 month ago and got a little better and th.*comment was truncated* EXAM: CHEST  2 VIEW COMPARISON:  08/23/2012 FINDINGS: Mild hyperinflation. Midline  trachea. Normal heart size and mediastinal contours. No pleural effusion or pneumothorax. Biapical pleural thickening. Moderate lower lobe predominant interstitial thickening. Somewhat more focal patchy airspace disease is suspected at the right lung base, especially laterally. Probable bilateral nipple shadows. Numerous leads and wires project over the chest. IMPRESSION: Suspicion of right lower lobe pneumonia. Followup PA and lateral chest X-ray is recommended in 3-4 weeks following trial of antibiotic therapy to ensure resolution and exclude underlying malignancy. Moderate lower lobe predominant interstitial thickening may relate to chronic bronchitis/ smoking. Superimposed acute/subacute bronchitis cannot be excluded. Probable bilateral nipple shadows. Repeat frontal radiograph with nipple markers could confirm. Electronically Signed   By: Jeronimo Greaves M.D.   On: 03/12/2016 10:32   Dg Wrist Complete Right Result Date: 03/12/2016 CLINICAL DATA:  Some type of sore/abrsion on anterior side of wrist on ulnar side at base of palm with red streaking up to elbow. Pt has altered mental status and couldn't really tell me what happened or how long, just that it hurts and she has a fever. EXAM: RIGHT WRIST - COMPLETE 3+ VIEW COMPARISON:  None. FINDINGS: There is no evidence of fracture or dislocation. There is no evidence of arthropathy or other focal bone abnormality. No radiodense foreign body or subcutaneous gas. Soft tissues are unremarkable. IMPRESSION: Negative. Electronically Signed   By: Corlis Leak M.D.   On: 03/12/2016 10:30    0915:  Pt yelling on arrival to ED, thrashing about on stretcher, holding unmarked pill bottle. Pill ID by ED RN:  Prednisone. Unable to get clear HPI. EPIC chart reviewed: pt does have cocaine and etoh use hx. Will dose IV ativan for possible  withdrawal.   1000:  Pt's mother here: asking "why aren't you doing anything?" and "where's her infection?" I explained that pt has been  uncooperative thus far, and encouraged pt's mother to talk with pt regarding cooperating with ED staff and complete workup. Pt's mother verb understanding.   1030:  Pt continues agitated, coughing with runny nose. Pt's mother states pt's friend who lives with her "rolls up [employer] receipts and puts tobacco in them and smokes them." Pt feels this is the reason she is coughing and "probably has pneumonia."  Short neb given.   1205:  APAP given with improvement in fever. IV rocephin and doxy given for CAP and cellulitis.  T/C to [facility] Kerry HoughMemon, case discussed, including:  HPI, pertinent PM/SHx, VS/PE, dx testing, ED course and treatment:  Agreeable to admit, requests he will come to the ED for evaluation.     Final Clinical Impressions(s) / ED Diagnoses   Final diagnoses:  None    New Prescriptions New Prescriptions   No medications on file      Samuel JesterKathleen Nihaal Friesen, DO 03/14/16 [id number]

## 2016-03-13 DIAGNOSIS — F419 Anxiety disorder, unspecified: Secondary | ICD-10-CM

## 2016-03-13 LAB — CBC WITH DIFFERENTIAL/PLATELET
Basophils Absolute: 0 10*3/uL (ref 0.0–0.1)
Basophils Relative: 1 %
Eosinophils Absolute: 0.1 10*3/uL (ref 0.0–0.7)
Eosinophils Relative: 4 %
HCT: 29.5 % — ABNORMAL LOW (ref 36.0–46.0)
Hemoglobin: 10.2 g/dL — ABNORMAL LOW (ref 12.0–15.0)
Lymphocytes Relative: 63 %
Lymphs Abs: 1 10*3/uL (ref 0.7–4.0)
MCH: 28.2 pg (ref 26.0–34.0)
MCHC: 34.6 g/dL (ref 30.0–36.0)
MCV: 81.5 fL (ref 78.0–100.0)
Monocytes Absolute: 0.5 10*3/uL (ref 0.1–1.0)
Monocytes Relative: 31 %
Neutro Abs: 0 10*3/uL — ABNORMAL LOW (ref 1.7–7.7)
Neutrophils Relative %: 1 %
Platelets: 227 10*3/uL (ref 150–400)
RBC: 3.62 MIL/uL — ABNORMAL LOW (ref 3.87–5.11)
RDW: 12.6 % (ref 11.5–15.5)
WBC: 1.6 10*3/uL — ABNORMAL LOW (ref 4.0–10.5)

## 2016-03-13 LAB — BASIC METABOLIC PANEL
Anion gap: 7 (ref 5–15)
BUN: 5 mg/dL — ABNORMAL LOW (ref 6–20)
CO2: 24 mmol/L (ref 22–32)
Calcium: 7.5 mg/dL — ABNORMAL LOW (ref 8.9–10.3)
Chloride: 105 mmol/L (ref 101–111)
Creatinine, Ser: 0.63 mg/dL (ref 0.44–1.00)
GFR calc Af Amer: >60 mL/min (ref >60)
GFR calc non Af Amer: >60 mL/min (ref >60)
Glucose, Bld: 93 mg/dL (ref 65–99)
Potassium: 3.4 mmol/L — ABNORMAL LOW (ref 3.5–5.1)
Sodium: 136 mmol/L (ref 135–145)

## 2016-03-13 LAB — STREP PNEUMONIAE URINARY ANTIGEN: Strep Pneumo Urinary Antigen: NEGATIVE

## 2016-03-13 LAB — URINE CULTURE: Culture: NO GROWTH

## 2016-03-13 MED ORDER — DEXTROSE 5 % IV SOLN
INTRAVENOUS | Status: AC
  Filled 2016-03-13: qty 500

## 2016-03-13 MED ORDER — HEPARIN SODIUM (PORCINE) 5000 UNIT/ML IJ SOLN
5000.0000 [IU] | Freq: Three times a day (TID) | INTRAMUSCULAR | Status: DC
  Filled 2016-03-13 (×5): qty 1

## 2016-03-13 MED ORDER — KETOROLAC TROMETHAMINE 30 MG/ML IJ SOLN: 30.0000 mg | Freq: Four times a day (QID) | INTRAMUSCULAR | Status: DC | PRN

## 2016-03-13 MED ORDER — SODIUM CHLORIDE 0.9 % IV BOLUS (SEPSIS)
500.0000 mL | Freq: Once | INTRAVENOUS | Status: AC
  Administered 2016-03-13: 500 mL via INTRAVENOUS

## 2016-03-13 MED ORDER — ACETAMINOPHEN 325 MG PO TABS
650.0000 mg | ORAL_TABLET | ORAL | Status: DC | PRN
  Administered 2016-03-13 (×2): 650 mg via ORAL
  Filled 2016-03-13 (×2): qty 2

## 2016-03-13 NOTE — Clinical Social Work Note (Signed)
Pt requesting letter be sent to clerk of court as she was due in court this morning. Pt approved content of letter. Letter faxed to number provided by pt and CSW gave original to her.  Derenda FennelKara Wilmina Maxham, LCSW [phone]

## 2016-03-13 NOTE — Progress Notes (Signed)
PROGRESS NOTE    Christine Vargas  [id number] DOB: Jul 08, 1975 DOA: 03/12/2016 PCP: Colette RibasGOLDING, JOHN CABOT, MD    Brief Narrative:  41 year old female with a history of polysubstance abuse, presented to the hospital with complaints of pain in her right forearm. She was also having cough and shortness of breath for several days prior to admission. Genitourinary acquired pneumonia and cellulitis of her right forearm. She was started on antibiotics and admitted for further treatment.   Assessment & Plan:   Active Problems:   CAP (community acquired pneumonia)   Cellulitis   Cocaine abuse   Neutropenia (HCC)   Anxiety   Tobacco abuse   Alcohol abuse   1. Cellulitis of right forearm. Started the patient on ceftriaxone. HIV and process. She received tetanus shot in ED. cellulitis appears to be improving. Continue current treatments.  2. CAP. Started on ceftriaxone and azithromycin. Continues to have fevers. Blood cultures and urine cultures have been ordered. Lactic acid normal.   3. Neutropenia. Possibly related to infection. Recheck in AM. HIV and process  4. History of alcohol abuse. Possible history of alcohol abuse. alcohol level is negative in ED. ?withdrawal. She is very anxious. On CIWA protocol  5. Cocaine abuse. Will request social work consult   DVT prophylaxis: heparin Code Status: full Family Communication: discussed with mother at bedside Disposition Plan: discharge home once improved   Consultants:     Procedures:     Antimicrobials:   Rocephin 2/7>>  Azithromycin 2/7>>   Subjective: Somnolent, but wakes up to voice. Recently received Ativan. Feels right arm pain getting better  Objective: Vitals:   03/13/16 0432 03/13/16 0738 03/13/16 1200 03/13/16 1401  BP: (!) 92/51  (!) 107/53   Pulse: 88  95   Resp: 18  18   Temp: (!) 100.9 F (38.3 C)  (!) 102.3 F (39.1 C)   TempSrc: Oral  Oral   SpO2: 96% 95% 99% 98%  Weight:      Height:         Intake/Output Summary (Last 24 hours) at 03/13/16 1814 Last data filed at 03/13/16 1657  Gross per 24 hour  Intake          3065.42 ml  Output              800 ml  Net          2265.42 ml   Filed Weights   03/12/16 1537 03/12/16 1859  Weight: 43.5 kg (96 lb) 49.3 kg (108 lb 11 oz)    Examination:  General exam: Appears calm and comfortable  Respiratory system: occasional rhonchi, no wheezing. Respiratory effort normal. Cardiovascular system: S1 & S2 heard, RRR. No JVD, murmurs, rubs, gallops or clicks. No pedal edema. Gastrointestinal system: Abdomen is nondistended, soft and nontender. No organomegaly or masses felt. Normal bowel sounds heard. Central nervous system: Alert and oriented. No focal neurological deficits. Extremities: Symmetric 5 x 5 power. Skin: erythema over right forearm improving. Psychiatry: somnolent    Data Reviewed: I have personally reviewed following labs and imaging studies  CBC:  Recent Labs Lab 03/12/16 0917 03/13/16 0610  WBC 1.9* 1.6*  NEUTROABS 0.0* 0.0*  HGB 11.8* 10.2*  HCT 33.4* 29.5*  MCV 79.3 81.5  PLT 330 227   Basic Metabolic Panel:  Recent Labs Lab 03/12/16 0917 03/13/16 0610  NA 131* 136  K 3.5 3.4*  CL 95* 105  CO2 25 24  GLUCOSE 109* 93  BUN 10 5*  CREATININE  0.67 0.63  CALCIUM 8.8* 7.5*   GFR: Estimated Creatinine Clearance: 72 mL/min (by C-G formula based on SCr of 0.63 mg/dL). Liver Function Tests:  Recent Labs Lab 03/12/16 0917  AST 22  ALT 14  ALKPHOS 52  BILITOT 0.7  PROT 7.8  ALBUMIN 3.4*   No results for input(s): LIPASE, AMYLASE in the last 168 hours. No results for input(s): AMMONIA in the last 168 hours. Coagulation Profile: No results for input(s): INR, PROTIME in the last 168 hours. Cardiac Enzymes: No results for input(s): CKTOTAL, CKMB, CKMBINDEX, TROPONINI in the last 168 hours. BNP (last 3 results) No results for input(s): PROBNP in the last 8760 hours. HbA1C: No results  for input(s): HGBA1C in the last 72 hours. CBG: No results for input(s): GLUCAP in the last 168 hours. Lipid Profile: No results for input(s): CHOL, HDL, LDLCALC, TRIG, CHOLHDL, LDLDIRECT in the last 72 hours. Thyroid Function Tests: No results for input(s): TSH, T4TOTAL, FREET4, T3FREE, THYROIDAB in the last 72 hours. Anemia Panel: No results for input(s): VITAMINB12, FOLATE, FERRITIN, TIBC, IRON, RETICCTPCT in the last 72 hours. Sepsis Labs:  Recent Labs Lab 03/12/16 1610  LATICACIDVEN 1.4    Recent Results (from the past 240 hour(s))  Culture, blood (routine x 2)     Status: None (Preliminary result)   Collection Time: 03/12/16  9:17 AM  Result Value Ref Range Status   Specimen Description BLOOD RIGHT FOREARM DRAWN BY RN  Final   Special Requests BOTTLES DRAWN AEROBIC AND ANAEROBIC 8CC  Final   Culture NO GROWTH < 24 HOURS  Final   Report Status PENDING  Incomplete  Urine culture     Status: None   Collection Time: 03/12/16  9:23 AM  Result Value Ref Range Status   Specimen Description URINE, CATHETERIZED  Final   Special Requests NONE  Final   Culture   Final    NO GROWTH Performed at [facility], [address] [place], [place] [address]    Report Status 03/13/2016 FINAL  Final  Culture, blood (routine x 2)     Status: None (Preliminary result)   Collection Time: 03/12/16  9:26 AM  Result Value Ref Range Status   Specimen Description BLOOD LEFT ARM  Final   Special Requests BOTTLES DRAWN AEROBIC AND ANAEROBIC 8CC  Final   Culture NO GROWTH < 24 HOURS  Final   Report Status PENDING  Incomplete         Radiology Studies: Dg Chest 2 View  Result Date: 03/12/2016 CLINICAL DATA:  Altered mental status. Pt is complaining with difficulty breathing and chest pain. She has runny nose and says she has to lay flat. She has a fever. She said that she had been sick about 1 month ago and got a little better and th.*comment was truncated* EXAM: CHEST  2 VIEW  COMPARISON:  08/23/2012 FINDINGS: Mild hyperinflation. Midline trachea. Normal heart size and mediastinal contours. No pleural effusion or pneumothorax. Biapical pleural thickening. Moderate lower lobe predominant interstitial thickening. Somewhat more focal patchy airspace disease is suspected at the right lung base, especially laterally. Probable bilateral nipple shadows. Numerous leads and wires project over the chest. IMPRESSION: Suspicion of right lower lobe pneumonia. Followup PA and lateral chest X-ray is recommended in 3-4 weeks following trial of antibiotic therapy to ensure resolution and exclude underlying malignancy. Moderate lower lobe predominant interstitial thickening may relate to chronic bronchitis/ smoking. Superimposed acute/subacute bronchitis cannot be excluded. Probable bilateral nipple shadows. Repeat frontal radiograph  with nipple markers could confirm. Electronically Signed   By: Jeronimo Greaves M.D.   On: 03/12/2016 10:32   Dg Wrist Complete Right  Result Date: 03/12/2016 CLINICAL DATA:  Some type of sore/abrsion on anterior side of wrist on ulnar side at base of palm with red streaking up to elbow. Pt has altered mental status and couldn't really tell me what happened or how long, just that it hurts and she has a fever. EXAM: RIGHT WRIST - COMPLETE 3+ VIEW COMPARISON:  None. FINDINGS: There is no evidence of fracture or dislocation. There is no evidence of arthropathy or other focal bone abnormality. No radiodense foreign body or subcutaneous gas. Soft tissues are unremarkable. IMPRESSION: Negative. Electronically Signed   By: Corlis Leak M.D.   On: 03/12/2016 10:30        Scheduled Meds: . azithromycin  500 mg Intravenous Q24H  . cefTRIAXone (ROCEPHIN)  IV  2 g Intravenous Q24H  . enoxaparin (LOVENOX) injection  40 mg Subcutaneous Q24H  . folic acid  1 mg Oral Daily  . guaiFENesin  1,200 mg Oral BID  . ipratropium-albuterol  3 mL Nebulization Q6H  . LORazepam  0-4 mg  Intravenous Q6H   Followed by  . [START ON 03/15/2016] LORazepam  0-4 mg Intravenous Q12H  . thiamine  100 mg Oral Daily   Or  . thiamine  100 mg Intravenous Daily   Continuous Infusions: . sodium chloride 125 mL/hr at 03/13/16 0635     LOS: 1 day    Time spent:    MEMON,JEHANZEB, MD [facility] Pager [phone]  If 7PM-7AM, please contact night-coverage [email or web address] Password [facility] 03/13/2016, 6:14 PM

## 2016-03-13 NOTE — Plan of Care (Signed)
Problem: Safety: Goal: Ability to remain free from injury will improve Outcome: Progressing Pt verbalized understanding

## 2016-03-14 LAB — CBC WITH DIFFERENTIAL/PLATELET
Band Neutrophils: 0 %
Basophils Absolute: 0 10*3/uL (ref 0.0–0.1)
Basophils Relative: 0 %
Blasts: 0 %
Eosinophils Absolute: 0.1 10*3/uL (ref 0.0–0.7)
Eosinophils Relative: 3 %
HCT: 28.3 % — ABNORMAL LOW (ref 36.0–46.0)
Hemoglobin: 9.6 g/dL — ABNORMAL LOW (ref 12.0–15.0)
Lymphocytes Relative: 83 %
Lymphs Abs: 1.4 10*3/uL (ref 0.7–4.0)
MCH: 28.1 pg (ref 26.0–34.0)
MCHC: 33.9 g/dL (ref 30.0–36.0)
MCV: 82.7 fL (ref 78.0–100.0)
Metamyelocytes Relative: 3 %
Monocytes Absolute: 0.1 10*3/uL (ref 0.1–1.0)
Monocytes Relative: 3 %
Myelocytes: 1 %
Neutro Abs: 0.2 10*3/uL — ABNORMAL LOW (ref 1.7–7.7)
Neutrophils Relative %: 7 %
Platelets: 246 10*3/uL (ref 150–400)
Promyelocytes Absolute: 0 %
RBC: 3.42 MIL/uL — ABNORMAL LOW (ref 3.87–5.11)
RDW: 12.7 % (ref 11.5–15.5)
WBC: 1.8 10*3/uL — ABNORMAL LOW (ref 4.0–10.5)
nRBC: 0 /100 WBC

## 2016-03-14 LAB — BASIC METABOLIC PANEL
Anion gap: 8 (ref 5–15)
BUN: 5 mg/dL — ABNORMAL LOW (ref 6–20)
CO2: 23 mmol/L (ref 22–32)
Calcium: 7.8 mg/dL — ABNORMAL LOW (ref 8.9–10.3)
Chloride: 107 mmol/L (ref 101–111)
Creatinine, Ser: 0.54 mg/dL (ref 0.44–1.00)
GFR calc Af Amer: >60 mL/min (ref >60)
GFR calc non Af Amer: >60 mL/min (ref >60)
Glucose, Bld: 108 mg/dL — ABNORMAL HIGH (ref 65–99)
Potassium: 3.4 mmol/L — ABNORMAL LOW (ref 3.5–5.1)
Sodium: 138 mmol/L (ref 135–145)

## 2016-03-14 LAB — HIV ANTIBODY (ROUTINE TESTING W REFLEX): HIV Screen 4th Generation wRfx: NONREACTIVE

## 2016-03-14 NOTE — Progress Notes (Signed)
PROGRESS NOTE    Christine Vargas  ZOX:[id number] DOB: 09/01/1975 DOA: 03/12/2016 PCP: Colette RibasGOLDING, JOHN CABOT, MD    Brief Narrative:  41 year old female with a history of polysubstance abuse, presented to the hospital with complaints of pain in her right forearm. She was also having cough and shortness of breath for several days prior to admission. Genitourinary acquired pneumonia and cellulitis of her right forearm. She was started on antibiotics and admitted for further treatment.   Assessment & Plan:   Active Problems:   CAP (community acquired pneumonia)   Cellulitis   Cocaine abuse   Neutropenia (HCC)   Anxiety   Tobacco abuse   Alcohol abuse   1. Cellulitis of right forearm. Started the patient on ceftriaxone. HIV and process. She received tetanus shot in ED. cellulitis appears to be improving. Continue current treatments. Fevers improving.  2. CAP. Started on ceftriaxone and azithromycin. Blood cultures and urine cultures have not shown any growth. Lactic acid normal.   3. Neutropenia. Possibly related to infection. Recheck in AM. HIV negative. ANC mildly better today  4. History of alcohol abuse. Possible history of alcohol abuse. alcohol level is negative in ED. ?withdrawal. She is very anxious. On CIWA protocol  5. Cocaine abuse. Will request social work consult   DVT prophylaxis: heparin Code Status: full Family Communication: discussed with mother at bedside Disposition Plan: discharge home once improved   Consultants:     Procedures:     Antimicrobials:   Rocephin 2/7>>  Azithromycin 2/7>>   Subjective: Coughing is non productive. Right forearm is feeling better  Objective: Vitals:   03/14/16 0202 03/14/16 0907 03/14/16 1233 03/14/16 1351  BP: 122/65  121/73   Pulse: (!) 106  87   Resp: 18  20   Temp: 100.2 F (37.9 C)  98.7 F (37.1 C)   TempSrc: Oral  Oral   SpO2: 95% 94% 97% 96%  Weight:      Height:        Intake/Output  Summary (Last 24 hours) at 03/14/16 1535 Last data filed at 03/14/16 1233  Gross per 24 hour  Intake          1214.58 ml  Output             1100 ml  Net           114.58 ml   Filed Weights   03/12/16 1537 03/12/16 1859  Weight: 43.5 kg (96 lb) 49.3 kg (108 lb 11 oz)    Examination:  General exam: Appears calm and comfortable, more awake today  Respiratory system: clear bilaterally. Respiratory effort normal. Cardiovascular system: S1 & S2 heard, RRR. No JVD, murmurs, rubs, gallops or clicks. No pedal edema. Gastrointestinal system: Abdomen is nondistended, soft and nontender. No organomegaly or masses felt. Normal bowel sounds heard. Central nervous system: Alert and oriented. No focal neurological deficits. Extremities: Symmetric 5 x 5 power. Skin: erythema over right forearm improving. Psychiatry: awake, tearful    Data Reviewed: I have personally reviewed following labs and imaging studies  CBC:  Recent Labs Lab 03/12/16 0917 03/13/16 0610 03/14/16 0456  WBC 1.9* 1.6* 1.8*  NEUTROABS 0.0* 0.0* 0.2*  HGB 11.8* 10.2* 9.6*  HCT 33.4* 29.5* 28.3*  MCV 79.3 81.5 82.7  PLT 330 227 246   Basic Metabolic Panel:  Recent Labs Lab 03/12/16 0917 03/13/16 0610 03/14/16 0456  NA 131* 136 138  K 3.5 3.4* 3.4*  CL 95* 105 107  CO2 25 24 23  GLUCOSE 109* 93 108*  BUN 10 5* 5*  CREATININE 0.67 0.63 0.54  CALCIUM 8.8* 7.5* 7.8*   GFR: Estimated Creatinine Clearance: 72 mL/min (by C-G formula based on SCr of 0.54 mg/dL). Liver Function Tests:  Recent Labs Lab 03/12/16 0917  AST 22  ALT 14  ALKPHOS 52  BILITOT 0.7  PROT 7.8  ALBUMIN 3.4*   No results for input(s): LIPASE, AMYLASE in the last 168 hours. No results for input(s): AMMONIA in the last 168 hours. Coagulation Profile: No results for input(s): INR, PROTIME in the last 168 hours. Cardiac Enzymes: No results for input(s): CKTOTAL, CKMB, CKMBINDEX, TROPONINI in the last 168 hours. BNP (last 3  results) No results for input(s): PROBNP in the last 8760 hours. HbA1C: No results for input(s): HGBA1C in the last 72 hours. CBG: No results for input(s): GLUCAP in the last 168 hours. Lipid Profile: No results for input(s): CHOL, HDL, LDLCALC, TRIG, CHOLHDL, LDLDIRECT in the last 72 hours. Thyroid Function Tests: No results for input(s): TSH, T4TOTAL, FREET4, T3FREE, THYROIDAB in the last 72 hours. Anemia Panel: No results for input(s): VITAMINB12, FOLATE, FERRITIN, TIBC, IRON, RETICCTPCT in the last 72 hours. Sepsis Labs:  Recent Labs Lab 03/12/16 1610  LATICACIDVEN 1.4    Recent Results (from the past 240 hour(s))  Culture, blood (routine x 2)     Status: None (Preliminary result)   Collection Time: 03/12/16  9:17 AM  Result Value Ref Range Status   Specimen Description BLOOD RIGHT FOREARM DRAWN BY RN  Final   Special Requests BOTTLES DRAWN AEROBIC AND ANAEROBIC 8CC  Final   Culture NO GROWTH 2 DAYS  Final   Report Status PENDING  Incomplete  Urine culture     Status: None   Collection Time: 03/12/16  9:23 AM  Result Value Ref Range Status   Specimen Description URINE, CATHETERIZED  Final   Special Requests NONE  Final   Culture   Final    NO GROWTH Performed at [facility], [address] [place], [place] [address]    Report Status 03/13/2016 FINAL  Final  Culture, blood (routine x 2)     Status: None (Preliminary result)   Collection Time: 03/12/16  9:26 AM  Result Value Ref Range Status   Specimen Description BLOOD LEFT ARM  Final   Special Requests BOTTLES DRAWN AEROBIC AND ANAEROBIC 8CC  Final   Culture NO GROWTH 2 DAYS  Final   Report Status PENDING  Incomplete         Radiology Studies: No results found.      Scheduled Meds: . azithromycin  500 mg Intravenous Q24H  . cefTRIAXone (ROCEPHIN)  IV  2 g Intravenous Q24H  . folic acid  1 mg Oral Daily  . guaiFENesin  1,200 mg Oral BID  . heparin subcutaneous  5,000 Units Subcutaneous Q8H  .  ipratropium-albuterol  3 mL Nebulization Q6H  . thiamine  100 mg Oral Daily   Or  . thiamine  100 mg Intravenous Daily   Continuous Infusions: . sodium chloride 125 mL/hr at 03/14/16 1131     LOS: 2 days    Time spent:    Antwan Pandya, MD [facility] Pager [phone]  If 7PM-7AM, please contact night-coverage [email or web address] Password [email or web address] 03/14/2016, 3:35 PM

## 2016-03-14 NOTE — Progress Notes (Signed)
Patient scoring highly on CIWA.  Patient has been overly sedated during the day, though.  Wetting herself today.  Patients mother concerned, patient and mother states this is not her norm.  MD also in to assess patient, would like to hold off on any sedatives at this time. Will continue to monitor.

## 2016-03-15 LAB — CBC WITH DIFFERENTIAL/PLATELET
Basophils Absolute: 0 10*3/uL (ref 0.0–0.1)
Basophils Relative: 1 %
Eosinophils Absolute: 0.2 10*3/uL (ref 0.0–0.7)
Eosinophils Relative: 10 %
HCT: 28.9 % — ABNORMAL LOW (ref 36.0–46.0)
Hemoglobin: 9.6 g/dL — ABNORMAL LOW (ref 12.0–15.0)
Lymphocytes Relative: 60 %
Lymphs Abs: 1.2 10*3/uL (ref 0.7–4.0)
MCH: 27.6 pg (ref 26.0–34.0)
MCHC: 33.2 g/dL (ref 30.0–36.0)
MCV: 83 fL (ref 78.0–100.0)
Monocytes Absolute: 0.6 10*3/uL (ref 0.1–1.0)
Monocytes Relative: 29 %
Neutro Abs: 0 10*3/uL — ABNORMAL LOW (ref 1.7–7.7)
Neutrophils Relative %: 0 %
Platelets: 290 10*3/uL (ref 150–400)
RBC: 3.48 MIL/uL — ABNORMAL LOW (ref 3.87–5.11)
RDW: 12.9 % (ref 11.5–15.5)
WBC: 1.9 10*3/uL — ABNORMAL LOW (ref 4.0–10.5)

## 2016-03-15 LAB — EXPECTORATED SPUTUM ASSESSMENT W GRAM STAIN, RFLX TO RESP C: Special Requests: NORMAL

## 2016-03-15 LAB — LEGIONELLA PNEUMOPHILA SEROGP 1 UR AG: L. pneumophila Serogp 1 Ur Ag: NEGATIVE

## 2016-03-15 MED ORDER — BENZONATATE 100 MG PO CAPS
200.0000 mg | ORAL_CAPSULE | Freq: Three times a day (TID) | ORAL | Status: DC
  Administered 2016-03-15 – 2016-03-16 (×3): 200 mg via ORAL
  Filled 2016-03-15 (×4): qty 2

## 2016-03-15 MED ORDER — HYDROCODONE-HOMATROPINE 5-1.5 MG/5ML PO SYRP
5.0000 mL | ORAL_SOLUTION | ORAL | Status: DC | PRN
  Administered 2016-03-15 (×2): 5 mL via ORAL
  Filled 2016-03-15 (×2): qty 5

## 2016-03-15 MED ORDER — HYDROCORTISONE 1 % EX CREA
TOPICAL_CREAM | Freq: Three times a day (TID) | CUTANEOUS | Status: DC
  Administered 2016-03-15: 1 via TOPICAL
  Filled 2016-03-15: qty 1.5

## 2016-03-15 MED ORDER — METHYLPREDNISOLONE SODIUM SUCC 125 MG IJ SOLR
60.0000 mg | Freq: Two times a day (BID) | INTRAMUSCULAR | Status: DC
  Administered 2016-03-15 – 2016-03-16 (×2): 60 mg via INTRAVENOUS
  Filled 2016-03-15 (×3): qty 2

## 2016-03-15 MED ORDER — IPRATROPIUM-ALBUTEROL 0.5-2.5 (3) MG/3ML IN SOLN
3.0000 mL | Freq: Three times a day (TID) | RESPIRATORY_TRACT | Status: DC
  Administered 2016-03-16 (×2): 3 mL via RESPIRATORY_TRACT
  Filled 2016-03-15 (×2): qty 3

## 2016-03-15 MED ORDER — DM-GUAIFENESIN ER 30-600 MG PO TB12
1.0000 | ORAL_TABLET | Freq: Two times a day (BID) | ORAL | Status: DC
  Filled 2016-03-15 (×3): qty 1

## 2016-03-15 NOTE — Progress Notes (Signed)
Pharmacy Antibiotic Note  Christine Vargas is a 41 y.o. female admitted on 03/12/2016 with pneumonia.  Pharmacy has been consulted for ROCEPHIN dosing. Patient is improving.   Plan: Continue Rocephin 2gm IV q24hrs Monitor labs, progress, c/s Deescalate as indicated  Height: 5\' 2"  (157.5 cm) Weight: 108 lb 11 oz (49.3 kg) IBW/kg (Calculated) : 50.1  Temp (24hrs), Avg:99.1 F (37.3 C), Min:98.5 F (36.9 C), Max:99.8 F (37.7 C)   Recent Labs Lab 03/12/16 0917 03/13/16 0610 03/14/16 0456 03/15/16 0610  WBC 1.9* 1.6* 1.8* 1.9*  CREATININE 0.67 0.63 0.54  --   LATICACIDVEN 1.4  --   --   --     Estimated Creatinine Clearance: 72 mL/min (by C-G formula based on SCr of 0.54 mg/dL).    Allergies  Allergen Reactions  . Bee Venom Anaphylaxis  . Ibuprofen Other (See Comments)    REACTION: advised by MD to stop due to kidney health  . Augmentin [Amoxicillin-Pot Clavulanate] Rash  . Elastic Bandages & [Zinc] Rash  . Gold-Containing Drug Products Rash  . Grapefruit Concentrate Rash  . Latex Rash and Other (See Comments)    Redness, warm to touch  . Nickel Rash  . Other Rash    SYNTHETIC PLASTIC  . Sulfa Antibiotics Rash  . Tramadol Rash    BRAND NAME: Ultram    Antimicrobials this admission: Rocephin 2/7 >>  Azithromycin 2/7>> Dose adjustments this admission:  Microbiology results: 2/7 BCx: ngtd  2/7 UCx: no growth    Thank you for allowing pharmacy to be a part of this patient's care.  Elder CyphersLorie Karee Forge, BS Pharm D, BCPS Clinical Pharmacist Pager [phone] 03/15/2016 1:29 PM

## 2016-03-15 NOTE — Progress Notes (Signed)
PROGRESS NOTE    Christine ShieldsFrankie H Vargas  ZOX:[id number] DOB: 1975/03/29 DOA: 03/12/2016 PCP: Colette RibasGOLDING, JOHN CABOT, MD    Brief Narrative:  41 year old female with a history of polysubstance abuse, presented to the hospital with complaints of pain in her right forearm. She was also having cough and shortness of breath for several days prior to admission. Genitourinary acquired pneumonia and cellulitis of her right forearm. She was started on antibiotics and admitted for further treatment.   Assessment & Plan:   Active Problems:   CAP (community acquired pneumonia)   Cellulitis   Cocaine abuse   Neutropenia (HCC)   Anxiety   Tobacco abuse   Alcohol abuse   1. Cellulitis of right forearm. Started the patient on ceftriaxone. HIV and process. She received tetanus shot in ED. cellulitis appears to be improving. Continue current treatments. Fevers improving.  2. CAP. Started on ceftriaxone and azithromycin. Blood cultures and urine cultures have not shown any growth. Lactic acid normal. Since she's continued to have significant cough and wheezing, despite receiving bronchodilators, will give a short course of steroids.  3. Neutropenia. Possibly related to infection. Recheck in AM. HIV negative. Continue to follow. If does not improve, may need hematology input.  4. History of alcohol abuse. Possible history of alcohol abuse. alcohol level is negative in ED. ?withdrawal. She is very anxious. On CIWA protocol  5. Cocaine abuse. Will request social work consult   DVT prophylaxis: heparin Code Status: full Family Communication: discussed with mother at bedside Disposition Plan: discharge home once improved   Consultants:     Procedures:     Antimicrobials:   Rocephin 2/7>>  Azithromycin 2/7>>   Subjective: She continues to have persistent coughing and screams out that she can breathe. Still appears anxious.  Objective: Vitals:   03/15/16 0651 03/15/16 1118 03/15/16  1403 03/15/16 1507  BP:   106/64   Pulse:   90   Resp:   16   Temp:   99.1 F (37.3 C)   TempSrc:   Oral   SpO2: 97% 98% 97% 98%  Weight:      Height:        Intake/Output Summary (Last 24 hours) at 03/15/16 1701 Last data filed at 03/15/16 1300  Gross per 24 hour  Intake              720 ml  Output             2350 ml  Net            -1630 ml   Filed Weights   03/12/16 1537 03/12/16 1859  Weight: 43.5 kg (96 lb) 49.3 kg (108 lb 11 oz)    Examination:  General exam: Appears calm and comfortable, more awake today  Respiratory system: Bilateral rhonchi. Respiratory effort normal. Cardiovascular system: S1 & S2 heard, RRR. No JVD, murmurs, rubs, gallops or clicks. No pedal edema. Gastrointestinal system: Abdomen is nondistended, soft and nontender. No organomegaly or masses felt. Normal bowel sounds heard. Central nervous system: Alert and oriented. No focal neurological deficits. Extremities: Symmetric 5 x 5 power. Skin: erythema over right forearm improving. Psychiatry: awake, tearful    Data Reviewed: I have personally reviewed following labs and imaging studies  CBC:  Recent Labs Lab 03/12/16 0917 03/13/16 0610 03/14/16 0456 03/15/16 0610  WBC 1.9* 1.6* 1.8* 1.9*  NEUTROABS 0.0* 0.0* 0.2* 0.0*  HGB 11.8* 10.2* 9.6* 9.6*  HCT 33.4* 29.5* 28.3* 28.9*  MCV 79.3 81.5 82.7 83.0  PLT 330 227 246 290   Basic Metabolic Panel:  Recent Labs Lab 03/12/16 0917 03/13/16 0610 03/14/16 0456  NA 131* 136 138  K 3.5 3.4* 3.4*  CL 95* 105 107  CO2 25 24 23   GLUCOSE 109* 93 108*  BUN 10 5* 5*  CREATININE 0.67 0.63 0.54  CALCIUM 8.8* 7.5* 7.8*   GFR: Estimated Creatinine Clearance: 72 mL/min (by C-G formula based on SCr of 0.54 mg/dL). Liver Function Tests:  Recent Labs Lab 03/12/16 0917  AST 22  ALT 14  ALKPHOS 52  BILITOT 0.7  PROT 7.8  ALBUMIN 3.4*   No results for input(s): LIPASE, AMYLASE in the last 168 hours. No results for input(s): AMMONIA in  the last 168 hours. Coagulation Profile: No results for input(s): INR, PROTIME in the last 168 hours. Cardiac Enzymes: No results for input(s): CKTOTAL, CKMB, CKMBINDEX, TROPONINI in the last 168 hours. BNP (last 3 results) No results for input(s): PROBNP in the last 8760 hours. HbA1C: No results for input(s): HGBA1C in the last 72 hours. CBG: No results for input(s): GLUCAP in the last 168 hours. Lipid Profile: No results for input(s): CHOL, HDL, LDLCALC, TRIG, CHOLHDL, LDLDIRECT in the last 72 hours. Thyroid Function Tests: No results for input(s): TSH, T4TOTAL, FREET4, T3FREE, THYROIDAB in the last 72 hours. Anemia Panel: No results for input(s): VITAMINB12, FOLATE, FERRITIN, TIBC, IRON, RETICCTPCT in the last 72 hours. Sepsis Labs:  Recent Labs Lab 03/12/16 4098  LATICACIDVEN 1.4    Recent Results (from the past 240 hour(s))  Culture, blood (routine x 2)     Status: None (Preliminary result)   Collection Time: 03/12/16  9:17 AM  Result Value Ref Range Status   Specimen Description BLOOD RIGHT FOREARM DRAWN BY RN  Final   Special Requests BOTTLES DRAWN AEROBIC AND ANAEROBIC 8CC  Final   Culture NO GROWTH 2 DAYS  Final   Report Status PENDING  Incomplete  Urine culture     Status: None   Collection Time: 03/12/16  9:23 AM  Result Value Ref Range Status   Specimen Description URINE, CATHETERIZED  Final   Special Requests NONE  Final   Culture   Final    NO GROWTH Performed at [facility], [address], [place], [place] [address]    Report Status 03/13/2016 FINAL  Final  Culture, blood (routine x 2)     Status: None (Preliminary result)   Collection Time: 03/12/16  9:26 AM  Result Value Ref Range Status   Specimen Description BLOOD LEFT ARM  Final   Special Requests BOTTLES DRAWN AEROBIC AND ANAEROBIC 8CC  Final   Culture NO GROWTH 2 DAYS  Final   Report Status PENDING  Incomplete         Radiology Studies: No results found.      Scheduled  Meds: . azithromycin  500 mg Intravenous Q24H  . benzonatate  200 mg Oral TID  . cefTRIAXone (ROCEPHIN)  IV  2 g Intravenous Q24H  . dextromethorphan-guaiFENesin  1 tablet Oral BID  . folic acid  1 mg Oral Daily  . heparin subcutaneous  5,000 Units Subcutaneous Q8H  . ipratropium-albuterol  3 mL Nebulization Q6H  . methylPREDNISolone (SOLU-MEDROL) injection  60 mg Intravenous Q12H  . thiamine  100 mg Oral Daily   Or  . thiamine  100 mg Intravenous Daily   Continuous Infusions: . sodium chloride 75 mL/hr at 03/14/16 2147     LOS: 3 days  Time spent:    MEMON,JEHANZEB, MD [facility] Pager [phone]  If 7PM-7AM, please contact night-coverage [email or web address] Password [email or web address] 03/15/2016, 5:01 PM

## 2016-03-15 NOTE — Progress Notes (Signed)
Sputum collected for lab at 2200 on 03/15/16 not accepted. Order reentered. Collect in am if able for better sample for analysis.

## 2016-03-16 LAB — BASIC METABOLIC PANEL
Anion gap: 8 (ref 5–15)
BUN: 11 mg/dL (ref 6–20)
CO2: 27 mmol/L (ref 22–32)
Calcium: 9 mg/dL (ref 8.9–10.3)
Chloride: 104 mmol/L (ref 101–111)
Creatinine, Ser: 0.42 mg/dL — ABNORMAL LOW (ref 0.44–1.00)
GFR calc Af Amer: >60 mL/min (ref >60)
GFR calc non Af Amer: >60 mL/min (ref >60)
Glucose, Bld: 143 mg/dL — ABNORMAL HIGH (ref 65–99)
Potassium: 4.6 mmol/L (ref 3.5–5.1)
Sodium: 139 mmol/L (ref 135–145)

## 2016-03-16 LAB — CBC WITH DIFFERENTIAL/PLATELET
Basophils Absolute: 0 10*3/uL (ref 0.0–0.1)
Basophils Relative: 0 %
Eosinophils Absolute: 0 10*3/uL (ref 0.0–0.7)
Eosinophils Relative: 1 %
HCT: 31.6 % — ABNORMAL LOW (ref 36.0–46.0)
Hemoglobin: 10.4 g/dL — ABNORMAL LOW (ref 12.0–15.0)
Lymphocytes Relative: 52 %
Lymphs Abs: 0.4 10*3/uL — ABNORMAL LOW (ref 0.7–4.0)
MCH: 27.4 pg (ref 26.0–34.0)
MCHC: 32.9 g/dL (ref 30.0–36.0)
MCV: 83.2 fL (ref 78.0–100.0)
Monocytes Absolute: 0.4 10*3/uL (ref 0.1–1.0)
Monocytes Relative: 43 %
Neutro Abs: 0 10*3/uL — ABNORMAL LOW (ref 1.7–7.7)
Neutrophils Relative %: 4 %
Platelets: 325 10*3/uL (ref 150–400)
RBC: 3.8 MIL/uL — ABNORMAL LOW (ref 3.87–5.11)
RDW: 12.8 % (ref 11.5–15.5)
WBC: 0.8 10*3/uL — CL (ref 4.0–10.5)

## 2016-03-16 MED ORDER — BENZONATATE 200 MG PO CAPS: 200.0000 mg | ORAL_CAPSULE | Freq: Three times a day (TID) | ORAL | 0 refills | Status: DC

## 2016-03-16 MED ORDER — PREDNISONE 10 MG PO TABS: ORAL_TABLET | ORAL | 0 refills | Status: DC

## 2016-03-16 MED ORDER — AZITHROMYCIN 500 MG PO TABS: 500.0000 mg | ORAL_TABLET | Freq: Every day | ORAL | 0 refills | Status: AC

## 2016-03-16 MED ORDER — ALBUTEROL SULFATE HFA 108 (90 BASE) MCG/ACT IN AERS: 2.0000 | INHALATION_SPRAY | Freq: Four times a day (QID) | RESPIRATORY_TRACT | 0 refills | Status: DC | PRN

## 2016-03-16 MED ORDER — CEPHALEXIN 500 MG PO CAPS: 500.0000 mg | ORAL_CAPSULE | Freq: Three times a day (TID) | ORAL | 0 refills | Status: DC

## 2016-03-16 MED ORDER — DM-GUAIFENESIN ER 30-600 MG PO TB12: 1.0000 | ORAL_TABLET | Freq: Two times a day (BID) | ORAL | 0 refills | Status: DC

## 2016-03-16 NOTE — Discharge Summary (Signed)
Physician Discharge Summary  Christine Vargas ZOX:[id number] DOB: 09-25-1975 DOA: 03/12/2016  PCP: Colette Ribas, MD  Admit date: 03/12/2016 Discharge date: 03/16/2016  Admitted From: home Disposition:  home  Recommendations for Outpatient Follow-up:  1. Follow up with PCP in 1-2 weeks 2. Repeat CBC on follow-up with primary care physician to follow leukopenia/neutropenia.   Home Health: Equipment/Devices:  Discharge Condition: stable CODE STATUS: full code Diet recommendation: regular diet  Brief/Interim Summary: 41 year old female with a history of polysubstance abuse, presented to the hospital with complaints of pain in her right forearm. She was also having cough and shortness of breath for several days prior to admission. She was found to have a community acquired pneumonia and cellulitis of her right forearm. She was started on antibiotics and admitted for further treatment.  Discharge Diagnoses:  Active Problems:   CAP (community acquired pneumonia)   Cellulitis   Cocaine abuse   Neutropenia (HCC)   Anxiety   Tobacco abuse   Alcohol abuse  1. Cellulitis of right forearm. She was treated with ceftriaxone. HIV negative. She received tetanus shot in ED. Cellulitis has resolved and she is now afebrile..  2. CAP. Started on ceftriaxone and azithromycin. Influenza negative. Blood cultures and urine cultures have not shown any growth. Lactic acid normal. She continued to be short of breath and was started on a short course of steroids with improvement. She'll discharge on oral antibiotics and steroid taper.  3. Neutropenia. HIV negative. Neutropenia has persisted. Case reviewed with Dr. Clelia Croft on-call for hematology. It was felt that patient's neutropenia is likely related to her cocaine use. Since the patient is clinically better, no further workup was recommended at this time. It was recommended that patient abstain from further cocaine. She will need a repeat CBC in  1-2 weeks to follow neutropenia/leukopenia. If this is persistent on follow-up, she may benefit from referral to hematology. At this time, Dr. Clelia Croft did not feel the patient had a primary hematologic condition.  4. History of alcohol abuse. Possible history of alcohol abuse. alcohol level is negative in ED. she was treated with Ativan. No evidence of withdrawal at this time.  5. Cocaine abuse. Patient is interested in quitting and expressed interest to her mother to pursue rehabilitation  Discharge Instructions  Discharge Instructions    Diet - low sodium heart healthy    Complete by:  As directed    Increase activity slowly    Complete by:  As directed      Allergies as of 03/16/2016      Reactions   Bee Venom Anaphylaxis   Ibuprofen Other (See Comments)   REACTION: advised by MD to stop due to kidney health   Augmentin [amoxicillin-pot Clavulanate] Rash   Elastic Bandages & [zinc] Rash   Gold-containing Drug Products Rash   Grapefruit Concentrate Rash   Latex Rash, Other (See Comments)   Redness, warm to touch   Nickel Rash   Other Rash   SYNTHETIC PLASTIC   Sulfa Antibiotics Rash   Tramadol Rash   BRAND NAME: Ultram      Medication List    STOP taking these medications   magic mouthwash w/lidocaine Soln     TAKE these medications   albuterol 108 (90 Base) MCG/ACT inhaler Commonly known as:  PROVENTIL HFA;VENTOLIN HFA Inhale 2 puffs into the lungs every 6 (six) hours as needed for wheezing or shortness of breath.   azithromycin 500 MG tablet Commonly known as:  ZITHROMAX Take 1 tablet (  500 mg total) by mouth daily.   benzonatate 200 MG capsule Commonly known as:  TESSALON Take 1 capsule (200 mg total) by mouth 3 (three) times daily.   cephALEXin 500 MG capsule Commonly known as:  KEFLEX Take 1 capsule (500 mg total) by mouth 3 (three) times daily.   dextromethorphan-guaiFENesin 30-600 MG 12hr tablet Commonly known as:  MUCINEX DM Take 1 tablet by mouth 2  (two) times daily.   predniSONE 10 MG tablet Commonly known as:  DELTASONE Take 40mg  po daily for 2 days then 30mg  daily for 2 days then 20mg  daily for 2 days then 10mg  daily for 2 days then stop      Follow-up Information    Colette Ribas, MD Follow up.   Specialty:  Family Medicine Why:  follow up in 1-2 weeks and get repeat blood test to follow white blood cell count Contact information: [address] [place] [address] [phone]          Allergies  Allergen Reactions  . Bee Venom Anaphylaxis  . Ibuprofen Other (See Comments)    REACTION: advised by MD to stop due to kidney health  . Augmentin [Amoxicillin-Pot Clavulanate] Rash  . Elastic Bandages & [Zinc] Rash  . Gold-Containing Drug Products Rash  . Grapefruit Concentrate Rash  . Latex Rash and Other (See Comments)    Redness, warm to touch  . Nickel Rash  . Other Rash    SYNTHETIC PLASTIC  . Sulfa Antibiotics Rash  . Tramadol Rash    BRAND NAME: Ultram    Consultations:     Procedures/Studies: Dg Chest 2 View  Result Date: 03/12/2016 CLINICAL DATA:  Altered mental status. Pt is complaining with difficulty breathing and chest pain. She has runny nose and says she has to lay flat. She has a fever. She said that she had been sick about 1 month ago and got a little better and th.*comment was truncated* EXAM: CHEST  2 VIEW COMPARISON:  08/23/2012 FINDINGS: Mild hyperinflation. Midline trachea. Normal heart size and mediastinal contours. No pleural effusion or pneumothorax. Biapical pleural thickening. Moderate lower lobe predominant interstitial thickening. Somewhat more focal patchy airspace disease is suspected at the right lung base, especially laterally. Probable bilateral nipple shadows. Numerous leads and wires project over the chest. IMPRESSION: Suspicion of right lower lobe pneumonia. Followup PA and lateral chest X-ray is recommended in 3-4 weeks following trial of antibiotic therapy to  ensure resolution and exclude underlying malignancy. Moderate lower lobe predominant interstitial thickening may relate to chronic bronchitis/ smoking. Superimposed acute/subacute bronchitis cannot be excluded. Probable bilateral nipple shadows. Repeat frontal radiograph with nipple markers could confirm. Electronically Signed   By: Jeronimo Greaves M.D.   On: 03/12/2016 10:32   Dg Wrist Complete Right  Result Date: 03/12/2016 CLINICAL DATA:  Some type of sore/abrsion on anterior side of wrist on ulnar side at base of palm with red streaking up to elbow. Pt has altered mental status and couldn't really tell me what happened or how long, just that it hurts and she has a fever. EXAM: RIGHT WRIST - COMPLETE 3+ VIEW COMPARISON:  None. FINDINGS: There is no evidence of fracture or dislocation. There is no evidence of arthropathy or other focal bone abnormality. No radiodense foreign body or subcutaneous gas. Soft tissues are unremarkable. IMPRESSION: Negative. Electronically Signed   By: Corlis Leak M.D.   On: 03/12/2016 10:30       Subjective: Feeling better. Coughing improved. Shortness of breath has improved.  Discharge Exam: Vitals:   03/16/16 0515 03/16/16 1501  BP: 119/64 104/63  Pulse: 78 72  Resp: 16 20  Temp: 98.5 F (36.9 C) 98.2 F (36.8 C)   Vitals:   03/16/16 0515 03/16/16 0731 03/16/16 1350 03/16/16 1501  BP: 119/64   104/63  Pulse: 78   72  Resp: 16   20  Temp: 98.5 F (36.9 C)   98.2 F (36.8 C)  TempSrc: Oral   Oral  SpO2: 100% 97% 97% 98%  Weight:      Height:        General: Pt is alert, awake, not in acute distress Cardiovascular: RRR, S1/S2 +, no rubs, no gallops Respiratory: CTA bilaterally, no wheezing, no rhonchi Abdominal: Soft, NT, ND, bowel sounds + Extremities: no edema, no cyanosis    The results of significant diagnostics from this hospitalization (including imaging, microbiology, ancillary and laboratory) are listed below for reference.      Microbiology: Recent Results (from the past 240 hour(s))  Culture, blood (routine x 2)     Status: None (Preliminary result)   Collection Time: 03/12/16  9:17 AM  Result Value Ref Range Status   Specimen Description BLOOD RIGHT FOREARM DRAWN BY RN  Final   Special Requests BOTTLES DRAWN AEROBIC AND ANAEROBIC 8CC  Final   Culture NO GROWTH 2 DAYS  Final   Report Status PENDING  Incomplete  Urine culture     Status: None   Collection Time: 03/12/16  9:23 AM  Result Value Ref Range Status   Specimen Description URINE, CATHETERIZED  Final   Special Requests NONE  Final   Culture   Final    NO GROWTH Performed at [facility], [address], [place], [place] [address]    Report Status 03/13/2016 FINAL  Final  Culture, blood (routine x 2)     Status: None (Preliminary result)   Collection Time: 03/12/16  9:26 AM  Result Value Ref Range Status   Specimen Description BLOOD LEFT ARM  Final   Special Requests BOTTLES DRAWN AEROBIC AND ANAEROBIC 8CC  Final   Culture NO GROWTH 2 DAYS  Final   Report Status PENDING  Incomplete  Culture, sputum-assessment     Status: None   Collection Time: 03/15/16 10:00 PM  Result Value Ref Range Status   Specimen Description SPUTUM  Final   Special Requests Normal  Final   Sputum evaluation   Final    Sputum specimen not acceptable for testing.  Please recollect.   NURSE BRANDY NOTIFIED AT 2248 ON 03/15/2016 BY EVA    Report Status 03/15/2016 FINAL  Final     Labs: BNP (last 3 results) No results for input(s): BNP in the last 8760 hours. Basic Metabolic Panel:  Recent Labs Lab 03/12/16 0917 03/13/16 0610 03/14/16 0456 03/16/16 0659  NA 131* 136 138 139  K 3.5 3.4* 3.4* 4.6  CL 95* 105 107 104  CO2 25 24 23 27   GLUCOSE 109* 93 108* 143*  BUN 10 5* 5* 11  CREATININE 0.67 0.63 0.54 0.42*  CALCIUM 8.8* 7.5* 7.8* 9.0   Liver Function Tests:  Recent Labs Lab 03/12/16 0917  AST 22  ALT 14  ALKPHOS 52  BILITOT 0.7  PROT  7.8  ALBUMIN 3.4*   No results for input(s): LIPASE, AMYLASE in the last 168 hours. No results for input(s): AMMONIA in the last 168 hours. CBC:  Recent Labs Lab 03/12/16 0917 03/13/16 0610 03/14/16 0456 03/15/16 0610 03/16/16  0659  WBC 1.9* 1.6* 1.8* 1.9* 0.8*  NEUTROABS 0.0* 0.0* 0.2* 0.0* 0.0*  HGB 11.8* 10.2* 9.6* 9.6* 10.4*  HCT 33.4* 29.5* 28.3* 28.9* 31.6*  MCV 79.3 81.5 82.7 83.0 83.2  PLT 330 227 246 290 325   Cardiac Enzymes: No results for input(s): CKTOTAL, CKMB, CKMBINDEX, TROPONINI in the last 168 hours. BNP: Invalid input(s): POCBNP CBG: No results for input(s): GLUCAP in the last 168 hours. D-Dimer No results for input(s): DDIMER in the last 72 hours. Hgb A1c No results for input(s): HGBA1C in the last 72 hours. Lipid Profile No results for input(s): CHOL, HDL, LDLCALC, TRIG, CHOLHDL, LDLDIRECT in the last 72 hours. Thyroid function studies No results for input(s): TSH, T4TOTAL, T3FREE, THYROIDAB in the last 72 hours.  Invalid input(s): FREET3 Anemia work up No results for input(s): VITAMINB12, FOLATE, FERRITIN, TIBC, IRON, RETICCTPCT in the last 72 hours. Urinalysis    Component Value Date/Time   COLORURINE YELLOW 03/12/2016 0923   APPEARANCEUR CLEAR 03/12/2016 0923   LABSPEC 1.008 03/12/2016 0923   PHURINE 6.0 03/12/2016 0923   GLUCOSEU NEGATIVE 03/12/2016 0923   HGBUR SMALL (A) 03/12/2016 0923   BILIRUBINUR NEGATIVE 03/12/2016 0923   KETONESUR NEGATIVE 03/12/2016 0923   PROTEINUR NEGATIVE 03/12/2016 0923   UROBILINOGEN 0.2 06/05/2014 1341   NITRITE NEGATIVE 03/12/2016 0923   LEUKOCYTESUR NEGATIVE 03/12/2016 0923   Sepsis Labs Invalid input(s): PROCALCITONIN,  WBC,  LACTICIDVEN Microbiology Recent Results (from the past 240 hour(s))  Culture, blood (routine x 2)     Status: None (Preliminary result)   Collection Time: 03/12/16  9:17 AM  Result Value Ref Range Status   Specimen Description BLOOD RIGHT FOREARM DRAWN BY RN  Final   Special  Requests BOTTLES DRAWN AEROBIC AND ANAEROBIC 8CC  Final   Culture NO GROWTH 2 DAYS  Final   Report Status PENDING  Incomplete  Urine culture     Status: None   Collection Time: 03/12/16  9:23 AM  Result Value Ref Range Status   Specimen Description URINE, CATHETERIZED  Final   Special Requests NONE  Final   Culture   Final    NO GROWTH Performed at [facility], [address] [place], [place] [id number]    Report Status 03/13/2016 FINAL  Final  Culture, blood (routine x 2)     Status: None (Preliminary result)   Collection Time: 03/12/16  9:26 AM  Result Value Ref Range Status   Specimen Description BLOOD LEFT ARM  Final   Special Requests BOTTLES DRAWN AEROBIC AND ANAEROBIC 8CC  Final   Culture NO GROWTH 2 DAYS  Final   Report Status PENDING  Incomplete  Culture, sputum-assessment     Status: None   Collection Time: 03/15/16 10:00 PM  Result Value Ref Range Status   Specimen Description SPUTUM  Final   Special Requests Normal  Final   Sputum evaluation   Final    Sputum specimen not acceptable for testing.  Please recollect.   NURSE BRANDY NOTIFIED AT 2248 ON 03/15/2016 BY EVA    Report Status 03/15/2016 FINAL  Final     Time coordinating discharge: Over 30 minutes  SIGNED:   Erick BlinksMEMON,[place] Gipe, MD  [facility] 03/16/2016, 5:39 PM Pager   If 7PM-7AM, please contact night-coverage [email or web address] Password [email or web address]

## 2016-03-18 LAB — CULTURE, BLOOD (ROUTINE X 2)
Culture: NO GROWTH
Culture: NO GROWTH

## 2016-07-25 ENCOUNTER — Encounter (HOSPITAL_COMMUNITY): Payer: Self-pay | Admitting: Emergency Medicine

## 2016-07-25 ENCOUNTER — Emergency Department (HOSPITAL_COMMUNITY)
Admission: EM | Admit: 2016-07-25 | Discharge: 2016-07-25 | Disposition: A | Discharge status: Home / Self Care | Payer: Medicaid Other | Attending: Emergency Medicine | Admitting: Emergency Medicine

## 2016-07-25 DIAGNOSIS — W57XXXA Bitten or stung by nonvenomous insect and other nonvenomous arthropods, initial encounter: Secondary | ICD-10-CM | POA: Diagnosis not present

## 2016-07-25 DIAGNOSIS — S30864A Insect bite (nonvenomous) of vagina and vulva, initial encounter: Secondary | ICD-10-CM | POA: Diagnosis present

## 2016-07-25 DIAGNOSIS — F17210 Nicotine dependence, cigarettes, uncomplicated: Secondary | ICD-10-CM | POA: Diagnosis not present

## 2016-07-25 DIAGNOSIS — Y939 Activity, unspecified: Secondary | ICD-10-CM | POA: Diagnosis not present

## 2016-07-25 DIAGNOSIS — J45909 Unspecified asthma, uncomplicated: Secondary | ICD-10-CM | POA: Insufficient documentation

## 2016-07-25 DIAGNOSIS — Y929 Unspecified place or not applicable: Secondary | ICD-10-CM | POA: Insufficient documentation

## 2016-07-25 DIAGNOSIS — Z91040 Latex allergy status: Secondary | ICD-10-CM | POA: Insufficient documentation

## 2016-07-25 DIAGNOSIS — Z79899 Other long term (current) drug therapy: Secondary | ICD-10-CM | POA: Insufficient documentation

## 2016-07-25 DIAGNOSIS — Y999 Unspecified external cause status: Secondary | ICD-10-CM | POA: Insufficient documentation

## 2016-07-25 LAB — URINALYSIS, ROUTINE W REFLEX MICROSCOPIC
Bilirubin Urine: NEGATIVE
Glucose, UA: NEGATIVE mg/dL
Ketones, ur: NEGATIVE mg/dL
Leukocytes, UA: NEGATIVE
Nitrite: NEGATIVE
Protein, ur: NEGATIVE mg/dL
Specific Gravity, Urine: 1.009 (ref 1.005–1.030)
pH: 8 (ref 5.0–8.0)

## 2016-07-25 LAB — WET PREP, GENITAL
Sperm: NONE SEEN
Yeast Wet Prep HPF POC: NONE SEEN

## 2016-07-25 LAB — POC URINE PREG, ED: Preg Test, Ur: NEGATIVE

## 2016-07-25 LAB — GC/CHLAMYDIA PROBE AMP (~~LOC~~) NOT AT ARMC
Chlamydia: NEGATIVE
Neisseria Gonorrhea: NEGATIVE

## 2016-07-25 MED ORDER — LIDOCAINE 5 % EX OINT
TOPICAL_OINTMENT | Freq: Once | CUTANEOUS | Status: AC
  Administered 2016-07-25: 1 via TOPICAL
  Filled 2016-07-25: qty 35.44

## 2016-07-25 MED ORDER — CLINDAMYCIN HCL 150 MG PO CAPS: 450.0000 mg | ORAL_CAPSULE | Freq: Three times a day (TID) | ORAL | 0 refills | Status: AC

## 2016-07-25 MED ORDER — DIBUCAINE 1 % RE OINT: 1.0000 "application " | TOPICAL_OINTMENT | RECTAL | 0 refills | Status: DC | PRN

## 2016-07-25 MED ORDER — HYDROXYZINE HCL 25 MG PO TABS: 25.0000 mg | ORAL_TABLET | Freq: Four times a day (QID) | ORAL | 0 refills | Status: DC

## 2016-07-25 MED ORDER — LIDOCAINE 4 % EX CREA: TOPICAL_CREAM | Freq: Once | CUTANEOUS | Status: DC

## 2016-07-25 NOTE — ED Triage Notes (Signed)
Pt presents to ED for assessment of a possible flea bite between her labia and rectum.  States pain and itching has been going on for 1 week.

## 2016-07-25 NOTE — Discharge Instructions (Signed)
On exam he had a 1 cm ulceration to the right side of your external vagina that was very tender. I suspect that this first started as a flea bite and has kind major from itching. Surrounding this ulcer you have mild inflammation. We will treat her symptoms with hydroxyzine and calamine lotion for itching and clindamycin for possible superimposed local infection.  Monitor your symptoms. Contact your primary care provider today and schedule an appointment in 2 days for wound check. If you're unable to see your primary care provider in 2 days for a wound check he may return to the emergency department.  [facility] and make an appointment within one week if your symptoms start to improve.

## 2016-07-25 NOTE — ED Provider Notes (Signed)
[facility]-EMERGENCY DEPT Provider Note   CSN: [id number] Arrival date & time: 07/25/16  0051     History   Chief Complaint Chief Complaint  Patient presents with  . Insect Bite    HPI Christine Vargas is a 41 y.o. female who presents to the ED with swelling, pain, itching, and rash to outside in her vagina and rectal area for 1.5 weeks after being bit by a flea.  Pain and itching is unbearable, she now has a sore. Patient states that she saw a flea on her body and killed it. Her dog has fleas every now and then and she has gotten bit before by fleas.  Her itching and mild swelling is similar to previous flea bites, however never happened in vaginal area before. Dysuria and difficulty wiping and sitting secondary to flea bites and pain. Patient is sexually active with one female partner without condom or other birth control.  She has tried neosporin without relief. She is currently on her menstrual cycle.  HPI  Past Medical History:  Diagnosis Date  . Asthma   . Bipolar disorder (HCC)   . Chronic back pain   . Cocaine abuse   . Kidney disease   . Migraines   . Pneumonia   . Renal disorder     Patient Active Problem List   Diagnosis Date Noted  . CAP (community acquired pneumonia) 03/12/2016  . Cellulitis 03/12/2016  . Cocaine abuse 03/12/2016  . Neutropenia (HCC) 03/12/2016  . Anxiety 03/12/2016  . Tobacco abuse 03/12/2016  . Alcohol abuse 03/12/2016    Past Surgical History:  Procedure Laterality Date  . FINGER SURGERY     ring finger left hand  . MOUTH SURGERY      OB History    Gravida Para Term Preterm AB Living   7 5 5   2 5    SAB TAB Ectopic Multiple Live Births   2               Home Medications    Prior to Admission medications   Medication Sig Start Date End Date Taking? Authorizing Provider  albuterol (PROVENTIL HFA;VENTOLIN HFA) 108 (90 Base) MCG/ACT inhaler Inhale 2 puffs into the lungs every 6 (six) hours as needed for wheezing or shortness of  breath. 03/16/16   Erick Blinks, MD  azithromycin (ZITHROMAX) 500 MG tablet Take 1 tablet (500 mg total) by mouth daily. 03/16/16   Erick Blinks, MD  benzonatate (TESSALON) 200 MG capsule Take 1 capsule (200 mg total) by mouth 3 (three) times daily. 03/16/16   Erick Blinks, MD  cephALEXin (KEFLEX) 500 MG capsule Take 1 capsule (500 mg total) by mouth 3 (three) times daily. 03/16/16   Erick Blinks, MD  clindamycin (CLEOCIN) 150 MG capsule Take 3 capsules (450 mg total) by mouth 3 (three) times daily. 07/25/16 08/01/16  Liberty Handy, PA-C  dextromethorphan-guaiFENesin (MUCINEX DM) 30-600 MG 12hr tablet Take 1 tablet by mouth 2 (two) times daily. 03/16/16   Erick Blinks, MD  dibucaine (NUPERCAINAL) 1 % OINT Place 1 application rectally as needed for pain. 07/25/16   Liberty Handy, PA-C  hydrOXYzine (ATARAX/VISTARIL) 25 MG tablet Take 1 tablet (25 mg total) by mouth every 6 (six) hours. 07/25/16   Liberty Handy, PA-C  predniSONE (DELTASONE) 10 MG tablet Take 40mg  po daily for 2 days then 30mg  daily for 2 days then 20mg  daily for 2 days then 10mg  daily for 2 days then stop 03/16/16   Erick Blinks,  MD    Family History History reviewed. No pertinent family history.  Social History Social History  Substance Use Topics  . Smoking status: Current Every Day Smoker    Packs/day: 0.50    Years: 20.00    Types: Cigarettes  . Smokeless tobacco: Never Used  . Alcohol use No     Allergies   Bee venom; Ibuprofen; Augmentin [amoxicillin-pot clavulanate]; Elastic bandages & [zinc]; Gold-containing drug products; Grapefruit concentrate; Latex; Nickel; Other; Sulfa antibiotics; and Tramadol   Review of Systems Review of Systems  Constitutional: Negative for fever.  Respiratory: Negative for cough and shortness of breath.   Cardiovascular: Negative for chest pain.  Gastrointestinal: Negative for abdominal pain, constipation, diarrhea, nausea and vomiting.  Genitourinary:  Positive for dysuria, genital sores, vaginal bleeding (menstrual cycle) and vaginal pain. Negative for difficulty urinating.  Musculoskeletal: Negative for arthralgias, joint swelling and myalgias.  Skin: Positive for color change and rash.  Neurological: Negative for headaches.     Physical Exam Updated Vital Signs BP 131/77 (BP Location: Left Arm)   Pulse 88   Temp 98.4 F (36.9 C) (Oral)   Resp 16   SpO2 100%   Physical Exam  Constitutional: She is oriented to person, place, and time. She appears well-developed and well-nourished. No distress.  NAD.  HENT:  Head: Normocephalic and atraumatic.  Right Ear: External ear normal.  Left Ear: External ear normal.  Nose: Nose normal.  Mouth/Throat: Oropharynx is clear and moist.  Eyes: Conjunctivae and EOM are normal. Pupils are equal, round, and reactive to light. No scleral icterus.  Neck: Normal range of motion. Neck supple.  Cardiovascular: Normal rate, regular rhythm and normal heart sounds.   No murmur heard. Pulmonary/Chest: Effort normal and breath sounds normal. She has no wheezes.  Abdominal: Soft. Bowel sounds are normal. There is no tenderness.  Genitourinary:    Pelvic exam was performed with patient supine. There is rash, tenderness and lesion on the right labia. There is bleeding in the vagina.  Genitourinary Comments:  External genitalia has multiple insect bite marks with mild erythema, edema, tenderness 1 cm exquisitely tender ulcer to right labia majora with local erythema, edema and warmth and no significant fluctuance No groin lymphadenopathy.  Vaginal mucosa and cervix normal, pink without lesions.  Uterus in midline, smooth, not enlarged or tender.  No CMT. Non palpable adnexa.  Musculoskeletal: Normal range of motion. She exhibits no deformity.  Neurological: She is alert and oriented to person, place, and time.  Skin: Skin is warm and dry. Capillary refill takes less than 2 seconds. Rash noted.    Psychiatric: She has a normal mood and affect. Her behavior is normal. Judgment and thought content normal.  Nursing note and vitals reviewed.    ED Treatments / Results  Labs (all labs ordered are listed, but only abnormal results are displayed) Labs Reviewed  URINALYSIS, ROUTINE W REFLEX MICROSCOPIC - Abnormal; Notable for the following:       Result Value   Hgb urine dipstick MODERATE (*)    Bacteria, UA RARE (*)    Squamous Epithelial / LPF 0-5 (*)    All other components within normal limits  WET PREP, GENITAL  POC URINE PREG, ED  GC/CHLAMYDIA PROBE AMP ([place]) NOT AT [facility]    EKG  EKG Interpretation None       Radiology No results found.  Procedures Procedures (including critical care time)  Medications Ordered in ED Medications  lidocaine (XYLOCAINE) 5 % ointment (not  administered)     Initial Impression / Assessment and Plan / ED Course  I have reviewed the triage vital signs and the nursing notes.  Pertinent labs & imaging results that were available during my care of the patient were reviewed by me and considered in my medical decision making (see chart for details).    41 year old female with flea bites to external genitalia with some local erythema, edema but no obvious signs of abscess. Likely insect bite hypersensitivity. She has developed a 1 cm ulcer to right labia majora from itching. Exam otherwise reassuring. GC/chlamydia pending. Urinalysis without evidence of infection. Negative urine pregnancy. No other constitutional symptoms including fever, lymphadenopathy, joint pain. We'll discharge patient with hydroxyzine, calamine lotion, antibiotics to cover for possible superimposed infection and dibucaine ointment. Encouraged patient to see PCP in 2 days for wound check. If she cannot see PCP she is to come to ED.  Patient verbalized understanding and is agreeable with plan.    Final Clinical Impressions(s) / ED Diagnoses   Final diagnoses:   Insect bite, initial encounter  Flea bite of multiple sites    New Prescriptions New Prescriptions   CLINDAMYCIN (CLEOCIN) 150 MG CAPSULE    Take 3 capsules (450 mg total) by mouth 3 (three) times daily.   DIBUCAINE (NUPERCAINAL) 1 % OINT    Place 1 application rectally as needed for pain.   HYDROXYZINE (ATARAX/VISTARIL) 25 MG TABLET    Take 1 tablet (25 mg total) by mouth every 6 (six) hours.     Liberty HandyGibbons, Jakki Doughty J, PA-C 07/25/16 0630    Derwood KaplanNanavati, Ankit, MD 07/25/16 [phone]

## 2020-07-02 ENCOUNTER — Other Ambulatory Visit: Payer: Self-pay

## 2020-07-02 ENCOUNTER — Emergency Department (HOSPITAL_COMMUNITY)
Admission: EM | Admit: 2020-07-02 | Discharge: 2020-07-02 | Disposition: A | Discharge status: Home / Self Care | Payer: Self-pay | Attending: Emergency Medicine | Admitting: Emergency Medicine

## 2020-07-02 ENCOUNTER — Emergency Department (HOSPITAL_COMMUNITY): Payer: Self-pay

## 2020-07-02 ENCOUNTER — Encounter (HOSPITAL_COMMUNITY): Payer: Self-pay | Admitting: *Deleted

## 2020-07-02 DIAGNOSIS — Z91040 Latex allergy status: Secondary | ICD-10-CM | POA: Insufficient documentation

## 2020-07-02 DIAGNOSIS — F17210 Nicotine dependence, cigarettes, uncomplicated: Secondary | ICD-10-CM | POA: Insufficient documentation

## 2020-07-02 DIAGNOSIS — N189 Chronic kidney disease, unspecified: Secondary | ICD-10-CM | POA: Insufficient documentation

## 2020-07-02 DIAGNOSIS — Z20822 Contact with and (suspected) exposure to covid-19: Secondary | ICD-10-CM | POA: Insufficient documentation

## 2020-07-02 DIAGNOSIS — R059 Cough, unspecified: Secondary | ICD-10-CM | POA: Insufficient documentation

## 2020-07-02 DIAGNOSIS — J45909 Unspecified asthma, uncomplicated: Secondary | ICD-10-CM | POA: Insufficient documentation

## 2020-07-02 LAB — RESP PANEL BY RT-PCR (FLU A&B, COVID) ARPGX2
Influenza A by PCR: NEGATIVE
Influenza B by PCR: NEGATIVE
SARS Coronavirus 2 by RT PCR: NEGATIVE

## 2020-07-02 MED ORDER — BENZONATATE 100 MG PO CAPS
200.0000 mg | ORAL_CAPSULE | Freq: Once | ORAL | Status: AC
  Administered 2020-07-02: 200 mg via ORAL
  Filled 2020-07-02: qty 2

## 2020-07-02 MED ORDER — ALBUTEROL SULFATE HFA 108 (90 BASE) MCG/ACT IN AERS: 2.0000 | INHALATION_SPRAY | RESPIRATORY_TRACT | 0 refills | Status: AC | PRN

## 2020-07-02 MED ORDER — BENZONATATE 100 MG PO CAPS: 100.0000 mg | ORAL_CAPSULE | Freq: Three times a day (TID) | ORAL | 0 refills | Status: AC

## 2020-07-02 MED ORDER — PROMETHAZINE-DM 6.25-15 MG/5ML PO SYRP: 5.0000 mL | ORAL_SOLUTION | Freq: Four times a day (QID) | ORAL | 0 refills | Status: AC | PRN

## 2020-07-02 MED ORDER — ALBUTEROL SULFATE HFA 108 (90 BASE) MCG/ACT IN AERS
2.0000 | INHALATION_SPRAY | Freq: Once | RESPIRATORY_TRACT | Status: AC
  Administered 2020-07-02: 2 via RESPIRATORY_TRACT
  Filled 2020-07-02: qty 6.7

## 2020-07-02 MED ORDER — PREDNISONE 10 MG (21) PO TBPK: ORAL_TABLET | Freq: Every day | ORAL | 0 refills | Status: AC

## 2020-07-02 NOTE — ED Triage Notes (Signed)
Productive cough, requests note for work, states she has been coughing for x 10 days

## 2020-07-02 NOTE — Discharge Instructions (Signed)
Your chest x-ray is unremarkable.  I do have some concern for this being a COPD exacerbation.  We will prescribe you a short course of prednisone as well as an albuterol inhaler.  I have also provided you with a work note.  Your COVID and influenza test were negative.

## 2020-07-02 NOTE — ED Provider Notes (Signed)
[facility] EMERGENCY DEPARTMENT Provider Note   CSN: [id number] Arrival date & time: 07/02/20  1800     History Chief Complaint  Patient presents with  . Cough  . Shortness of Breath    Christine Vargas is a 45 y.o. female.  HPI  Patient is a 45 year old female with past medical history significant for asthma, COPD, bipolar, chronic back pain, CKD, pneumonia  Patient is presenting today with cough for the past 10 days.  She states that it has been mostly dry but occasionally productive of some white phlegm.  She denies any fevers or chills no nausea vomiting diarrhea headaches lightness or dizziness.  She states that she called out of work today because of a cough and like a work note.  This is her primary requests today.  She states she is not significantly short of breath.  She states that she feels unwell enough that she does not feel that she can work however.  She denies any chest pain.  No recent surgeries, hospitalization, long travel, hemoptysis, estrogen containing OCP, cancer history.  No unilateral leg swelling.  No history of PE or VTE.   She denies any significant associated symptoms.  No aggravating mitigating factors apart from some to her cough is worse with smoking.  She states that she has been trying to cut back on smoking.     Past Medical History:  Diagnosis Date  . Asthma   . Bipolar disorder (HCC)   . Chronic back pain   . Cocaine abuse (HCC)   . Kidney disease   . Migraines   . Pneumonia   . Renal disorder     Patient Active Problem List   Diagnosis Date Noted  . CAP (community acquired pneumonia) 03/12/2016  . Cellulitis 03/12/2016  . Cocaine abuse (HCC) 03/12/2016  . Neutropenia (HCC) 03/12/2016  . Anxiety 03/12/2016  . Tobacco abuse 03/12/2016  . Alcohol abuse 03/12/2016    Past Surgical History:  Procedure Laterality Date  . FINGER SURGERY     ring finger left hand  . MOUTH SURGERY       OB History    Gravida  7   Para   5   Term  5   Preterm      AB  2   Living  5     SAB  2   IAB      Ectopic      Multiple      Live Births              No family history on file.  Social History   Tobacco Use  . Smoking status: Current Some Day Smoker    Packs/day: 0.50    Years: 20.00    Pack years: 10.00    Types: Cigarettes  . Smokeless tobacco: Never Used  Substance Use Topics  . Alcohol use: No  . Drug use: Not Currently    Home Medications Prior to Admission medications   Medication Sig Start Date End Date Taking? Authorizing Provider  albuterol (VENTOLIN HFA) 108 (90 Base) MCG/ACT inhaler Inhale 2 puffs into the lungs every 4 (four) hours as needed for wheezing or shortness of breath. 07/02/20  Yes Ladd Cen S, PA  benzonatate (TESSALON) 100 MG capsule Take 1 capsule (100 mg total) by mouth every 8 (eight) hours. 07/02/20  Yes Gari Trovato S, PA  predniSONE (STERAPRED UNI-PAK 21 TAB) 10 MG (21) TBPK tablet Take by mouth daily. Take 6 tabs by  mouth daily  for 2 days, then 5 tabs for 2 days, then 4 tabs for 2 days, then 3 tabs for 2 days, 2 tabs for 2 days, then 1 tab by mouth daily for 2 days 07/02/20  Yes Reshad Saab S, PA  promethazine-dextromethorphan (PROMETHAZINE-DM) 6.25-15 MG/5ML syrup Take 5 mLs by mouth 4 (four) times daily as needed for cough. 07/02/20  Yes Anacaren Kohan S, PA  azithromycin (ZITHROMAX) 500 MG tablet Take 1 tablet (500 mg total) by mouth daily. Patient not taking: Reported on 07/02/2020 03/16/16   Erick Blinks, MD    Allergies    Bee venom, Ibuprofen, Augmentin [amoxicillin-pot clavulanate], Elastic bandages & [zinc], Gold-containing drug products, Grapefruit concentrate, Latex, Nickel, Other, Sulfa antibiotics, and Tramadol  Review of Systems   Review of Systems  Constitutional: Negative for fever.  HENT: Negative for congestion.   Respiratory: Positive for cough and shortness of breath (At work).   Cardiovascular: Negative for chest pain.   Gastrointestinal: Negative for abdominal distention.  Neurological: Negative for dizziness and headaches.    Physical Exam Updated Vital Signs BP 104/60   Pulse 68   Temp 98.2 F (36.8 C)   Resp (!) 22   SpO2 98%   Physical Exam Vitals and nursing note reviewed.  Constitutional:      General: She is not in acute distress.    Appearance: Normal appearance. She is not ill-appearing.     Comments: Pleasant well-appearing 46 year old.  In no acute distress.  Sitting comfortably in bed.  Able answer questions appropriately follow commands. No increased work of breathing. Speaking in full sentences.   HENT:     Head: Normocephalic and atraumatic.     Nose: Nose normal.     Mouth/Throat:     Mouth: Mucous membranes are moist.  Eyes:     General: No scleral icterus.       Right eye: No discharge.        Left eye: No discharge.     Conjunctiva/sclera: Conjunctivae normal.  Cardiovascular:     Rate and Rhythm: Normal rate and regular rhythm.     Pulses: Normal pulses.     Heart sounds: Normal heart sounds.  Pulmonary:     Effort: Pulmonary effort is normal. No respiratory distress.     Breath sounds: No stridor. No wheezing.     Comments: Coarse bronchovesicular lung sounds throughout.  No distinct wheezing. Abdominal:     Palpations: Abdomen is soft.     Tenderness: There is no abdominal tenderness. There is no guarding or rebound.  Musculoskeletal:     Cervical back: Normal range of motion.     Right lower leg: No edema.     Left lower leg: No edema.  Skin:    General: Skin is warm and dry.     Capillary Refill: Capillary refill takes less than 2 seconds.  Neurological:     Mental Status: She is alert and oriented to person, place, and time. Mental status is at baseline.  Psychiatric:        Mood and Affect: Mood normal.        Behavior: Behavior normal.     ED Results / Procedures / Treatments   Labs (all labs ordered are listed, but only abnormal results are  displayed) Labs Reviewed  RESP PANEL BY RT-PCR (FLU A&B, COVID) [id number]    EKG EKG Interpretation  Date/Time:  Monday Jul 02 2020 18:24:25 EDT Ventricular Rate:  66 PR Interval:  128 QRS  Duration: 97 QT Interval:  397 QTC Calculation: 416 R Axis:   81 Text Interpretation: Sinus rhythm RSR' in V1 or V2, probably normal variant Nonspecific T abnrm, anterolateral leads rate slower than prior 2/18 Confirmed by Meridee Score [phone]) on 07/02/2020 6:27:59 PM   Radiology DG Chest Port 1 View  Result Date: 07/02/2020 CLINICAL DATA:  Cough for several days, initial encounter EXAM: PORTABLE CHEST 1 VIEW COMPARISON:  03/12/2016 FINDINGS: Cardiac shadow is within normal limits. The lungs are hyperinflated. No focal infiltrate or sizable effusion is seen. No bony abnormality is noted. IMPRESSION: COPD without acute abnormality. Electronically Signed   By: Alcide Clever M.D.   On: 07/02/2020 19:23    Procedures Procedures   Medications Ordered in ED Medications  benzonatate (TESSALON) capsule 200 mg (200 mg Oral Given 07/02/20 1911)  albuterol (VENTOLIN HFA) 108 (90 Base) MCG/ACT inhaler 2 puff (2 puffs Inhalation Given 07/02/20 2014)    ED Course  I have reviewed the triage vital signs and the nursing notes.  Pertinent labs & imaging results that were available during my care of the patient were reviewed by me and considered in my medical decision making (see chart for details).    MDM Rules/Calculators/A&P                          Patient is 45 year old female Here today primarily for work note.  She is complaining of cough for 10 days. Lung exam is notable for coarse lung sounds throughout with no wheezing. She does have a history of asthma her chest x-ray is consistent with COPD and she is a heavy smoker.  I do suspect that she would benefit from some prednisone.  Will prescribe this as well as some antitussives including benzonatate Promethazine DM and prescribe patient a albuterol  inhaler.  On reassessment patient is in bed watching TV on her phone.  States that she feels better after antitussive.  COVID and influenza negative.  SPO2 within normal limits during ED visit.   Discharged home after evaluation.  She is agreeable to plan.  Understands return precautions.  Christine Vargas was evaluated in Emergency Department on 07/02/2020 for the symptoms described in the history of present illness. She was evaluated in the context of the global COVID-19 pandemic, which necessitated consideration that the patient might be at risk for infection with the SARS-CoV-2 virus that causes COVID-19. Institutional protocols and algorithms that pertain to the evaluation of patients at risk for COVID-19 are in a state of rapid change based on information released by regulatory bodies including the CDC and federal and state organizations. These policies and algorithms were followed during the patient's care in the ED.   Final Clinical Impression(s) / ED Diagnoses Final diagnoses:  Cough    Rx / DC Orders ED Discharge Orders         Ordered    predniSONE (STERAPRED UNI-PAK 21 TAB) 10 MG (21) TBPK tablet  Daily        07/02/20 2005    promethazine-dextromethorphan (PROMETHAZINE-DM) 6.25-15 MG/5ML syrup  4 times daily PRN        07/02/20 2005    benzonatate (TESSALON) 100 MG capsule  Every 8 hours        07/02/20 2005    albuterol (VENTOLIN HFA) 108 (90 Base) MCG/ACT inhaler  Every 4 hours PRN        07/02/20 2005  Solon AugustaFondaw, Azeez Dunker [place], [place] 07/02/20 2050    Terrilee FilesButler, Michael C, MD 07/03/20 1043

## 2021-03-30 ENCOUNTER — Emergency Department (HOSPITAL_COMMUNITY)
Admission: EM | Admit: 2021-03-30 | Discharge: 2021-03-30 | Disposition: A | Discharge status: Home / Self Care | Payer: Medicaid Other | Attending: Emergency Medicine | Admitting: Emergency Medicine

## 2021-03-30 ENCOUNTER — Emergency Department (HOSPITAL_COMMUNITY): Payer: Medicaid Other

## 2021-03-30 ENCOUNTER — Encounter (HOSPITAL_COMMUNITY): Payer: Self-pay

## 2021-03-30 ENCOUNTER — Other Ambulatory Visit: Payer: Self-pay

## 2021-03-30 DIAGNOSIS — J449 Chronic obstructive pulmonary disease, unspecified: Secondary | ICD-10-CM | POA: Insufficient documentation

## 2021-03-30 DIAGNOSIS — E878 Other disorders of electrolyte and fluid balance, not elsewhere classified: Secondary | ICD-10-CM | POA: Insufficient documentation

## 2021-03-30 DIAGNOSIS — Z91040 Latex allergy status: Secondary | ICD-10-CM | POA: Insufficient documentation

## 2021-03-30 DIAGNOSIS — F17200 Nicotine dependence, unspecified, uncomplicated: Secondary | ICD-10-CM | POA: Insufficient documentation

## 2021-03-30 DIAGNOSIS — E871 Hypo-osmolality and hyponatremia: Secondary | ICD-10-CM | POA: Insufficient documentation

## 2021-03-30 DIAGNOSIS — R7401 Elevation of levels of liver transaminase levels: Secondary | ICD-10-CM | POA: Insufficient documentation

## 2021-03-30 DIAGNOSIS — R000 Tachycardia, unspecified: Secondary | ICD-10-CM | POA: Insufficient documentation

## 2021-03-30 DIAGNOSIS — R112 Nausea with vomiting, unspecified: Secondary | ICD-10-CM

## 2021-03-30 DIAGNOSIS — R079 Chest pain, unspecified: Secondary | ICD-10-CM

## 2021-03-30 DIAGNOSIS — R0789 Other chest pain: Secondary | ICD-10-CM | POA: Insufficient documentation

## 2021-03-30 DIAGNOSIS — E876 Hypokalemia: Secondary | ICD-10-CM

## 2021-03-30 LAB — CBC WITH DIFFERENTIAL/PLATELET
Abs Immature Granulocytes: 0.05 10*3/uL (ref 0.00–0.07)
Basophils Absolute: 0.1 10*3/uL (ref 0.0–0.1)
Basophils Relative: 1 %
Eosinophils Absolute: 0.1 10*3/uL (ref 0.0–0.5)
Eosinophils Relative: 1 %
HCT: 48.7 % — ABNORMAL HIGH (ref 36.0–46.0)
Hemoglobin: 17.3 g/dL — ABNORMAL HIGH (ref 12.0–15.0)
Immature Granulocytes: 1 %
Lymphocytes Relative: 21 %
Lymphs Abs: 2.1 10*3/uL (ref 0.7–4.0)
MCH: 29.7 pg (ref 26.0–34.0)
MCHC: 35.5 g/dL (ref 30.0–36.0)
MCV: 83.7 fL (ref 80.0–100.0)
Monocytes Absolute: 0.9 10*3/uL (ref 0.1–1.0)
Monocytes Relative: 9 %
Neutro Abs: 6.9 10*3/uL (ref 1.7–7.7)
Neutrophils Relative %: 67 %
Platelets: 411 10*3/uL — ABNORMAL HIGH (ref 150–400)
RBC: 5.82 MIL/uL — ABNORMAL HIGH (ref 3.87–5.11)
RDW: 12.3 % (ref 11.5–15.5)
WBC: 10.1 10*3/uL (ref 4.0–10.5)
nRBC: 0 % (ref 0.0–0.2)

## 2021-03-30 LAB — URINALYSIS, ROUTINE W REFLEX MICROSCOPIC
Bilirubin Urine: NEGATIVE
Glucose, UA: NEGATIVE mg/dL
Ketones, ur: 5 mg/dL — AB
Leukocytes,Ua: NEGATIVE
Nitrite: NEGATIVE
Protein, ur: 30 mg/dL — AB
Specific Gravity, Urine: 1.019 (ref 1.005–1.030)
pH: 6 (ref 5.0–8.0)

## 2021-03-30 LAB — COMPREHENSIVE METABOLIC PANEL
ALT: 179 U/L — ABNORMAL HIGH (ref 0–44)
AST: 185 U/L — ABNORMAL HIGH (ref 15–41)
Albumin: 5 g/dL (ref 3.5–5.0)
Alkaline Phosphatase: 69 U/L (ref 38–126)
Anion gap: 15 (ref 5–15)
BUN: 39 mg/dL — ABNORMAL HIGH (ref 6–20)
CO2: 30 mmol/L (ref 22–32)
Calcium: 9.8 mg/dL (ref 8.9–10.3)
Chloride: 88 mmol/L — ABNORMAL LOW (ref 98–111)
Creatinine, Ser: 1.07 mg/dL — ABNORMAL HIGH (ref 0.44–1.00)
GFR, Estimated: >60 mL/min (ref >60)
Glucose, Bld: 151 mg/dL — ABNORMAL HIGH (ref 70–99)
Potassium: 2.6 mmol/L — CL (ref 3.5–5.1)
Sodium: 133 mmol/L — ABNORMAL LOW (ref 135–145)
Total Bilirubin: 2.2 mg/dL — ABNORMAL HIGH (ref 0.3–1.2)
Total Protein: 8.8 g/dL — ABNORMAL HIGH (ref 6.5–8.1)

## 2021-03-30 LAB — TROPONIN I (HIGH SENSITIVITY)
Troponin I (High Sensitivity): 7 ng/L (ref <18)
Troponin I (High Sensitivity): 8 ng/L (ref <18)

## 2021-03-30 MED ORDER — POTASSIUM CHLORIDE ER 10 MEQ PO TBCR: 10.0000 meq | EXTENDED_RELEASE_TABLET | Freq: Every day | ORAL | 0 refills | Status: AC

## 2021-03-30 MED ORDER — ONDANSETRON HCL 4 MG PO TABS: 4.0000 mg | ORAL_TABLET | Freq: Three times a day (TID) | ORAL | 0 refills | Status: AC | PRN

## 2021-03-30 MED ORDER — SODIUM CHLORIDE 0.9 % IV BOLUS
1000.0000 mL | Freq: Once | INTRAVENOUS | Status: AC
  Administered 2021-03-30: 1000 mL via INTRAVENOUS

## 2021-03-30 MED ORDER — POTASSIUM CHLORIDE CRYS ER 20 MEQ PO TBCR
40.0000 meq | EXTENDED_RELEASE_TABLET | Freq: Once | ORAL | Status: AC
  Administered 2021-03-30: 40 meq via ORAL
  Filled 2021-03-30: qty 2

## 2021-03-30 MED ORDER — MAGNESIUM SULFATE 2 GM/50ML IV SOLN
2.0000 g | Freq: Once | INTRAVENOUS | Status: AC
  Administered 2021-03-30: 2 g via INTRAVENOUS
  Filled 2021-03-30: qty 50

## 2021-03-30 NOTE — ED Notes (Signed)
Critical Lab: Potassium 2.6, provider notified.

## 2021-03-30 NOTE — ED Provider Notes (Signed)
[facility] Provider Note   CSN: [id number] Arrival date & time: 03/30/21  1710     History  Chief Complaint  Patient presents with   Chest Pain    Christine Vargas is a 46 y.o. female.  She is here with a complaint of some chest discomfort after falling into a truck bed while loading the truck.  This happened 4 days ago.  She has pain when she twists and turns.  She also said she had a diarrheal illness that started about the same time which is stopped but left her feeling generally weak.  She said she has lost 10 pounds.  She is asking for some IV fluids.  The history is provided by the patient.  Chest Pain Pain location:  L chest and R chest Pain quality: aching   Pain severity:  Moderate Onset quality:  Sudden Duration:  4 days Timing:  Intermittent Progression:  Improving Chronicity:  New Context: trauma   Relieved by:  Nothing Worsened by:  Movement Ineffective treatments:  Rest Associated symptoms: no abdominal pain, no cough, no diaphoresis, no fever, no headache, no nausea, no shortness of breath and no vomiting   Risk factors: smoking       Home Medications Prior to Admission medications   Medication Sig Start Date End Date Taking? Authorizing Provider  albuterol (VENTOLIN HFA) 108 (90 Base) MCG/ACT inhaler Inhale 2 puffs into the lungs every 4 (four) hours as needed for wheezing or shortness of breath. 07/02/20   Tedd Sias, PA  azithromycin (ZITHROMAX) 500 MG tablet Take 1 tablet (500 mg total) by mouth daily. Patient not taking: Reported on 07/02/2020 03/16/16   Kathie Dike, MD  benzonatate (TESSALON) 100 MG capsule Take 1 capsule (100 mg total) by mouth every 8 (eight) hours. 07/02/20   Tedd Sias, PA  predniSONE (STERAPRED UNI-PAK 21 TAB) 10 MG (21) TBPK tablet Take by mouth daily. Take 6 tabs by mouth daily  for 2 days, then 5 tabs for 2 days, then 4 tabs for 2 days, then 3 tabs for 2 days, 2 tabs for 2 days, then 1 tab  by mouth daily for 2 days 07/02/20   Tedd Sias, PA  promethazine-dextromethorphan (PROMETHAZINE-DM) 6.25-15 MG/5ML syrup Take 5 mLs by mouth 4 (four) times daily as needed for cough. 07/02/20   Tedd Sias, PA      Allergies    Bee venom, Ibuprofen, Augmentin [amoxicillin-pot clavulanate], Elastic bandages & [zinc], Gold-containing drug products, Grapefruit concentrate, Latex, Nickel, Other, Sulfa antibiotics, and Tramadol    Review of Systems   Review of Systems  Constitutional:  Negative for diaphoresis and fever.  HENT:  Negative for sore throat.   Eyes:  Negative for visual disturbance.  Respiratory:  Negative for cough and shortness of breath.   Cardiovascular:  Positive for chest pain.  Gastrointestinal:  Positive for diarrhea. Negative for abdominal pain, nausea and vomiting.  Genitourinary:  Negative for dysuria.  Musculoskeletal:  Negative for neck pain.  Skin:  Negative for rash.  Neurological:  Negative for headaches.   Physical Exam Updated Vital Signs BP 117/88 (BP Location: Right Arm)    Pulse (!) 113    Temp 97.8 F (36.6 C) (Oral)    Resp 18    Ht 5\' 2"  (1.575 m)    Wt 55.8 kg    SpO2 100%    BMI 22.50 kg/m  Physical Exam Vitals and nursing note reviewed.  Constitutional:  General: She is not in acute distress.    Appearance: She is underweight.  HENT:     Head: Normocephalic and atraumatic.  Eyes:     Conjunctiva/sclera: Conjunctivae normal.  Cardiovascular:     Rate and Rhythm: Regular rhythm. Tachycardia present.     Heart sounds: No murmur heard. Pulmonary:     Effort: Pulmonary effort is normal. No respiratory distress.     Breath sounds: Normal breath sounds.  Abdominal:     Palpations: Abdomen is soft.     Tenderness: There is no abdominal tenderness.  Musculoskeletal:        General: No swelling.     Cervical back: Neck supple.     Right lower leg: No edema.     Left lower leg: No edema.  Skin:    General: Skin is warm and dry.      Capillary Refill: Capillary refill takes less than 2 seconds.  Neurological:     General: No focal deficit present.     Mental Status: She is alert.    ED Results / Procedures / Treatments   Labs (all labs ordered are listed, but only abnormal results are displayed) Labs Reviewed  COMPREHENSIVE METABOLIC PANEL - Abnormal; Notable for the following components:      Result Value   Sodium 133 (*)    Potassium 2.6 (*)    Chloride 88 (*)    Glucose, Bld 151 (*)    BUN 39 (*)    Creatinine, Ser 1.07 (*)    Total Protein 8.8 (*)    AST 185 (*)    ALT 179 (*)    Total Bilirubin 2.2 (*)    All other components within normal limits  CBC WITH DIFFERENTIAL/PLATELET - Abnormal; Notable for the following components:   RBC 5.82 (*)    Hemoglobin 17.3 (*)    HCT 48.7 (*)    Platelets 411 (*)    All other components within normal limits  URINALYSIS, ROUTINE W REFLEX MICROSCOPIC - Abnormal; Notable for the following components:   Hgb urine dipstick SMALL (*)    Ketones, ur 5 (*)    Protein, ur 30 (*)    Bacteria, UA RARE (*)    All other components within normal limits  HEPATITIS PANEL, ACUTE  TROPONIN I (HIGH SENSITIVITY)  TROPONIN I (HIGH SENSITIVITY)    EKG EKG Interpretation  Date/Time:  Saturday March 30 2021 17:38:42 EST Ventricular Rate:  101 PR Interval:  115 QRS Duration: 94 QT Interval:  375 QTC Calculation: 487 R Axis:   73 Text Interpretation: Sinus tachycardia Right atrial enlargement RSR' in V1 or V2, probably normal variant Consider left ventricular hypertrophy ST depr, consider ischemia, inferior leads Borderline prolonged QT interval new ischemic changes compared with prior 5/22 Confirmed by Aletta Edouard [phone]) on 03/30/2021 7:08:12 PM  Radiology DG Chest Port 1 View  Result Date: 03/30/2021 CLINICAL DATA:  Fall.  Chest pressure and pain. EXAM: PORTABLE CHEST 1 VIEW COMPARISON:  None. FINDINGS: Normal cardiac silhouette. No pulmonary contusion or pleural  fluid. No pneumothorax. No acute thoracic fracture identified. Remote posterior RIGHT rib fracture noted. IMPRESSION: No radiographic evidence of acute thoracic trauma. Electronically Signed   By: Suzy Bouchard M.D.   On: 03/30/2021 18:39    Procedures Procedures    Medications Ordered in ED Medications  magnesium sulfate IVPB 2 g 50 mL (0 g Intravenous Stopped 03/30/21 1922)  sodium chloride 0.9 % bolus 1,000 mL (0 mLs Intravenous Stopped 03/30/21 2143)  sodium chloride 0.9 % bolus 1,000 mL (0 mLs Intravenous Stopped 03/30/21 2143)  potassium chloride SA (KLOR-CON M) CR tablet 40 mEq (40 mEq Oral Given 03/30/21 1923)    ED Course/ Medical Decision Making/ A&P Clinical Course as of 03/31/21 0859  Sat Mar 30, 2021  1830 Chest x-ray interpreted by me as COPD no pneumothorax no infiltrate.  Awaiting radiology reading. [MB]    Clinical Course User Index [MB] Hayden Rasmussen, MD                           Medical Decision Making Amount and/or Complexity of Data Reviewed Labs: ordered. Radiology: ordered.  Risk Prescription drug management.  This patient complains of chest pain weakness weight loss; this involves an extensive number of treatment Options and is a complaint that carries with it a high risk of complications and morbidity. The differential includes chest wall pain, ACS, dehydration, metabolic derangement, AKI, pneumothorax, rib fracture  I ordered, reviewed and interpreted labs, which included CBC with normal white count, hemoglobin higher than priors question reflecting some dehydration, chemistries with low sodium and chloride, low potassium, elevated BUN and creatinine, elevated LFTs, troponins flat, urinalysis without signs of infection I ordered medication IV fluids magnesium and oral potassium and reviewed PMP when indicated. I ordered imaging studies which included chest x-ray and I independently    visualized and interpreted imaging which showed no  pneumothorax Additional history obtained from patient's daughter Previous records obtained and reviewed in epic, no recent admissions Cardiac monitoring reviewed, sinus tachycardia improving to normal sinus rhythm Social determinants considered, tobacco use, prior history of drug use Critical Interventions: None  After the interventions stated above, I reevaluated the patient and found patient eating and drinking well without any distress. Admission and further testing considered, no indications for admission at this time.  We will put on some oral potassium and give prescription for some nausea medication.  Return instructions discussed          Final Clinical Impression(s) / ED Diagnoses Final diagnoses:  Nonspecific chest pain  Hypokalemia  Nausea and vomiting, unspecified vomiting type    Rx / DC Orders ED Discharge Orders          Ordered    ondansetron (ZOFRAN) 4 MG tablet  Every 8 hours PRN        03/30/21 2139    potassium chloride (KLOR-CON) 10 MEQ tablet  Daily        03/30/21 2140              Hayden Rasmussen, MD 03/31/21 [phone]

## 2021-03-30 NOTE — ED Triage Notes (Signed)
Fall tues and hit chest on flatbed. Also hit on the right on. CP is pressure. Feel like chest is "blown up."  Also some congestion per patient. Vomiting x2 days with blood (on tues/wed). No vomiting today.

## 2021-03-30 NOTE — Discharge Instructions (Addendum)
You were seen in the emergency department for chest pain along with weakness after having nausea and vomiting.  Your potassium was very low.  You were given IV fluids and potassium with improvement in your symptoms.  We are sending prescriptions to your pharmacy for nausea medication and potassium.  Please keep well-hydrated.  Follow-up with your doctor.  Return to the emergency department if any worsening or concerning symptoms

## 2021-03-31 LAB — HEPATITIS PANEL, ACUTE
HCV Ab: NONREACTIVE
Hep A IgM: NONREACTIVE
Hep B C IgM: NONREACTIVE
Hepatitis B Surface Ag: NONREACTIVE

## 2021-11-21 ENCOUNTER — Encounter: Payer: Self-pay | Admitting: *Deleted

## 2022-05-05 ENCOUNTER — Encounter: Payer: Self-pay | Admitting: *Deleted

## 2022-05-14 IMAGING — DX DG CHEST 1V PORT
1 series · 1 of 1 positions shown · non-contrast
Comparison: None.

CLINICAL DATA: Fall.  Chest pressure and pain.

EXAM:
PORTABLE CHEST 1 VIEW

[chest ap]
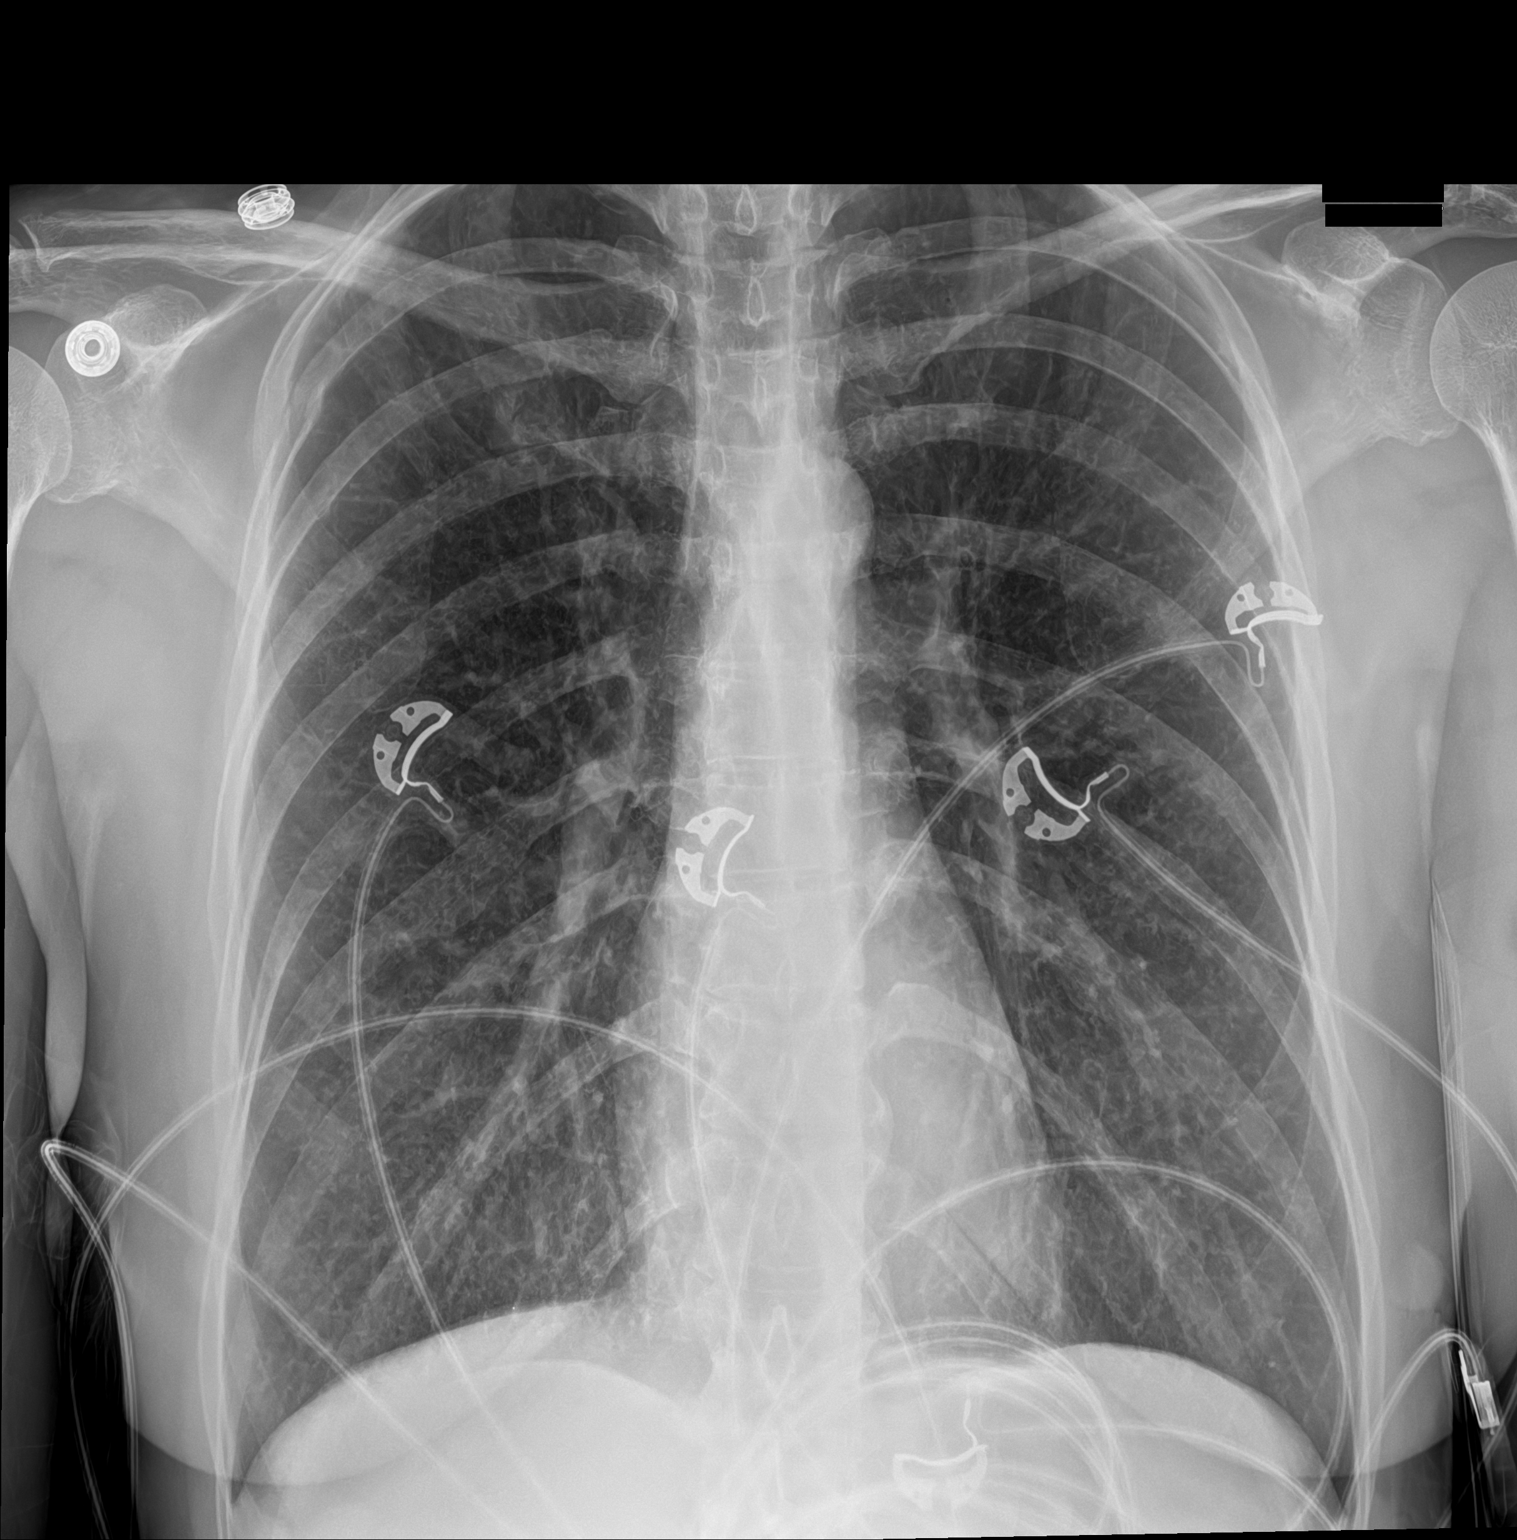

[1 of 1 positions shown; findings below may reference images not displayed]

FINDINGS: Normal cardiac silhouette. No pulmonary contusion or pleural fluid.
No pneumothorax. No acute thoracic fracture identified. Remote
posterior RIGHT rib fracture noted.
IMPRESSION: No radiographic evidence of acute thoracic trauma.

## 2023-03-18 ENCOUNTER — Encounter (HOSPITAL_COMMUNITY): Payer: Self-pay

## 2023-03-18 ENCOUNTER — Other Ambulatory Visit: Payer: Self-pay

## 2023-03-18 ENCOUNTER — Emergency Department (HOSPITAL_COMMUNITY)
Admission: EM | Admit: 2023-03-18 | Discharge: 2023-03-18 | Discharge status: Against Medical Advice | Payer: Self-pay | Attending: Emergency Medicine | Admitting: Emergency Medicine

## 2023-03-18 DIAGNOSIS — J101 Influenza due to other identified influenza virus with other respiratory manifestations: Secondary | ICD-10-CM | POA: Insufficient documentation

## 2023-03-18 DIAGNOSIS — Z5321 Procedure and treatment not carried out due to patient leaving prior to being seen by health care provider: Secondary | ICD-10-CM | POA: Insufficient documentation

## 2023-03-18 DIAGNOSIS — Z20822 Contact with and (suspected) exposure to covid-19: Secondary | ICD-10-CM | POA: Insufficient documentation

## 2023-03-18 LAB — CBC WITH DIFFERENTIAL/PLATELET
Abs Immature Granulocytes: 0 10*3/uL (ref 0.00–0.07)
Band Neutrophils: 7 %
Basophils Absolute: 0 10*3/uL (ref 0.0–0.1)
Basophils Relative: 0 %
Eosinophils Absolute: 0 10*3/uL (ref 0.0–0.5)
Eosinophils Relative: 0 %
HCT: 46.4 % — ABNORMAL HIGH (ref 36.0–46.0)
Hemoglobin: 15.6 g/dL — ABNORMAL HIGH (ref 12.0–15.0)
Lymphocytes Relative: 25 %
Lymphs Abs: 2.1 10*3/uL (ref 0.7–4.0)
MCH: 29.2 pg (ref 26.0–34.0)
MCHC: 33.6 g/dL (ref 30.0–36.0)
MCV: 86.7 fL (ref 80.0–100.0)
Monocytes Absolute: 0.5 10*3/uL (ref 0.1–1.0)
Monocytes Relative: 6 %
Neutro Abs: 5.7 10*3/uL (ref 1.7–7.7)
Neutrophils Relative %: 62 %
Platelets: 221 10*3/uL (ref 150–400)
RBC: 5.35 MIL/uL — ABNORMAL HIGH (ref 3.87–5.11)
RDW: 11.8 % (ref 11.5–15.5)
WBC: 8.3 10*3/uL (ref 4.0–10.5)
nRBC: 0 % (ref 0.0–0.2)

## 2023-03-18 LAB — BASIC METABOLIC PANEL
Anion gap: 13 (ref 5–15)
BUN: 16 mg/dL (ref 6–20)
CO2: 27 mmol/L (ref 22–32)
Calcium: 9.1 mg/dL (ref 8.9–10.3)
Chloride: 99 mmol/L (ref 98–111)
Creatinine, Ser: 0.66 mg/dL (ref 0.44–1.00)
GFR, Estimated: >60 mL/min (ref >60)
Glucose, Bld: 161 mg/dL — ABNORMAL HIGH (ref 70–99)
Potassium: 3.1 mmol/L — ABNORMAL LOW (ref 3.5–5.1)
Sodium: 139 mmol/L (ref 135–145)

## 2023-03-18 LAB — RESP PANEL BY RT-PCR (RSV, FLU A&B, COVID)  RVPGX2
Influenza A by PCR: NEGATIVE
Influenza B by PCR: NEGATIVE
Resp Syncytial Virus by PCR: NEGATIVE
SARS Coronavirus 2 by RT PCR: NEGATIVE

## 2023-03-18 NOTE — ED Notes (Signed)
Pt called with no answer- not in lobby-pt taken off epic board

## 2023-03-18 NOTE — ED Triage Notes (Signed)
Pt arrived via POV from home with multiple flu-like symptoms. Pt reports symptoms have been on-going X 1 week.

## 2023-08-11 ENCOUNTER — Emergency Department (HOSPITAL_COMMUNITY): Payer: Self-pay

## 2023-08-11 ENCOUNTER — Emergency Department (HOSPITAL_COMMUNITY)
Admission: EM | Admit: 2023-08-11 | Discharge: 2023-08-11 | Disposition: A | Discharge status: Home / Self Care | Payer: Self-pay | Attending: Emergency Medicine | Admitting: Emergency Medicine

## 2023-08-11 ENCOUNTER — Encounter (HOSPITAL_COMMUNITY): Payer: Self-pay | Admitting: Emergency Medicine

## 2023-08-11 ENCOUNTER — Other Ambulatory Visit: Payer: Self-pay

## 2023-08-11 DIAGNOSIS — M545 Low back pain, unspecified: Secondary | ICD-10-CM | POA: Insufficient documentation

## 2023-08-11 DIAGNOSIS — M546 Pain in thoracic spine: Secondary | ICD-10-CM | POA: Insufficient documentation

## 2023-08-11 DIAGNOSIS — X503XXA Overexertion from repetitive movements, initial encounter: Secondary | ICD-10-CM | POA: Insufficient documentation

## 2023-08-11 DIAGNOSIS — Y990 Civilian activity done for income or pay: Secondary | ICD-10-CM | POA: Insufficient documentation

## 2023-08-11 DIAGNOSIS — S39012A Strain of muscle, fascia and tendon of lower back, initial encounter: Secondary | ICD-10-CM

## 2023-08-11 DIAGNOSIS — Z91040 Latex allergy status: Secondary | ICD-10-CM | POA: Insufficient documentation

## 2023-08-11 MED ORDER — NAPROXEN 250 MG PO TABS
500.0000 mg | ORAL_TABLET | Freq: Once | ORAL | Status: AC
  Administered 2023-08-11: 500 mg via ORAL
  Filled 2023-08-11: qty 2

## 2023-08-11 MED ORDER — METHOCARBAMOL 500 MG PO TABS: 500.0000 mg | ORAL_TABLET | Freq: Two times a day (BID) | ORAL | 0 refills | Status: AC

## 2023-08-11 MED ORDER — NAPROXEN 375 MG PO TABS: 375.0000 mg | ORAL_TABLET | Freq: Two times a day (BID) | ORAL | 0 refills | Status: AC

## 2023-08-11 NOTE — ED Provider Notes (Signed)
 [place] EMERGENCY DEPARTMENT AT [facility] Provider Note   CSN: [id number] Arrival date & time: 08/11/23  1324     Patient presents with: Back Pain   Christine Vargas is a 48 y.o. female.   Patient is a 48 year old female who presents emergency department with a chief complaint of lower back pain.  Patient notes that she was doing heavy lifting at work today and notes that she has been experiencing pain since that time.  She has not taken anything for her pain at this point.  She denies any associated urinary bowel incontinence, saddle paresthesias, gait changes, fever, chills.  She denies any abdominal pain, nausea, vomiting, diarrhea.  She has had no dysuria or hematuria.  She denies any recent falls or blunt trauma to her back.   Back Pain      Prior to Admission medications   Medication Sig Start Date End Date Taking? Authorizing Provider  albuterol  (VENTOLIN  HFA) 108 (90 Base) MCG/ACT inhaler Inhale 2 puffs into the lungs every 4 (four) hours as needed for wheezing or shortness of breath. 07/02/20   Neldon Hamp RAMAN, PA  azithromycin  (ZITHROMAX ) 500 MG tablet Take 1 tablet (500 mg total) by mouth daily. Patient not taking: Reported on 07/02/2020 03/16/16   Antoinette Doe, MD  benzonatate  (TESSALON ) 100 MG capsule Take 1 capsule (100 mg total) by mouth every 8 (eight) hours. Patient not taking: Reported on 03/30/2021 07/02/20   Neldon Hamp RAMAN, PA  ondansetron  (ZOFRAN ) 4 MG tablet Take 1 tablet (4 mg total) by mouth every 8 (eight) hours as needed for nausea or vomiting. 03/30/21   Towana Ozell BROCKS, MD  potassium chloride  (KLOR-CON ) 10 MEQ tablet Take 1 tablet (10 mEq total) by mouth daily. 03/30/21   Towana Ozell BROCKS, MD  predniSONE  (STERAPRED UNI-PAK 21 TAB) 10 MG (21) TBPK tablet Take by mouth daily. Take 6 tabs by mouth daily  for 2 days, then 5 tabs for 2 days, then 4 tabs for 2 days, then 3 tabs for 2 days, 2 tabs for 2 days, then 1 tab by mouth daily for 2  days Patient not taking: Reported on 03/30/2021 07/02/20   Neldon Hamp RAMAN, PA  promethazine -dextromethorphan  (PROMETHAZINE -DM) 6.25-15 MG/5ML syrup Take 5 mLs by mouth 4 (four) times daily as needed for cough. Patient not taking: Reported on 03/30/2021 07/02/20   Neldon Hamp RAMAN, PA    Allergies: Bee venom, Ibuprofen, Augmentin [amoxicillin-pot clavulanate], Elastic bandages & [zinc], Gold-containing drug products, Grapefruit concentrate, Latex, Nickel, Other, Sulfa antibiotics, and Tramadol    Review of Systems  Musculoskeletal:  Positive for back pain.  All other systems reviewed and are negative.   Updated Vital Signs BP 123/62 (BP Location: Right Arm)   Pulse 63   Temp 98.6 F (37 C) (Oral)   Resp 18   Ht 5' 2.5 (1.588 m)   Wt 48.1 kg   LMP  (LMP Unknown) Comment: stopped 6 months to 1 year ago  SpO2 97%   BMI 19.08 kg/m   Physical Exam Vitals and nursing note reviewed.  Constitutional:      Appearance: Normal appearance.  HENT:     Head: Normocephalic and atraumatic.  Eyes:     Extraocular Movements: Extraocular movements intact.     Conjunctiva/sclera: Conjunctivae normal.     Pupils: Pupils are equal, round, and reactive to light.  Cardiovascular:     Rate and Rhythm: Normal rate and regular rhythm.     Pulses: Normal pulses.  Heart sounds: Normal heart sounds. No murmur heard.    No gallop.  Pulmonary:     Effort: Pulmonary effort is normal. No respiratory distress.     Breath sounds: Normal breath sounds. No stridor. No wheezing, rhonchi or rales.  Abdominal:     General: Abdomen is flat. Bowel sounds are normal. There is no distension.     Palpations: Abdomen is soft.     Tenderness: There is no abdominal tenderness. There is no guarding.  Musculoskeletal:        General: Normal range of motion.     Cervical back: Normal range of motion and neck supple. No rigidity or tenderness.     Comments: Nontender palpation over thoracic and lumbar spine, no  step-off or deformity, tenderness palpation noted along left paraspinous muscles of the thoracic and lumbar spine, tender to palpation along the lower bilateral back, no CVA tenderness  Skin:    General: Skin is warm and dry.  Neurological:     General: No focal deficit present.     Mental Status: She is alert and oriented to person, place, and time. Mental status is at baseline.     Cranial Nerves: No cranial nerve deficit.     Sensory: No sensory deficit.     Motor: No weakness.     Coordination: Coordination normal.     Gait: Gait normal.     Deep Tendon Reflexes: Reflexes normal.     Comments: Extension bilateral great toes intact, extension and flexion at hips intact  Psychiatric:        Mood and Affect: Mood normal.        Behavior: Behavior normal.        Thought Content: Thought content normal.        Judgment: Judgment normal.     (all labs ordered are listed, but only abnormal results are displayed) Labs Reviewed - No data to display  EKG: None  Radiology: No results found.   Procedures   Medications Ordered in the ED  naproxen  (NAPROSYN ) tablet 500 mg (has no administration in time range)                                    Medical Decision Making Patient is doing well at this time is stable for discharge home.  Discussed with patient that x-rays of demonstrated no signs of acute osseous injury or lesions.  Suspect muscle spasm at this time.  Will treat accordingly on outpatient basis.  Patient is TUNAFISH negative and has no concern neurological deficits.  Do not suspect underlying etiology such as cauda equina syndrome, vertebral mellitus, epidural abscess.  Do not suspect MRI is warranted at this time.  Patient is ambulating without difficulty in the emergency department.  Abdominal exam is benign with no indication underlying acute intra-abdominal surgical process.  The need for close follow-up with primary care doctor on an outpatient basis was discussed as  well as strict turn precautions for any new or worsening symptoms.  Patient voiced understanding and had no additional questions.  Amount and/or Complexity of Data Reviewed Radiology: ordered.  Risk Prescription drug management.        Final diagnoses:  None    ED Discharge Orders     None          Daralene Lonni JONETTA DEVONNA 08/11/23 1528    Towana Ozell BROCKS, MD 08/11/23 [phone]

## 2023-08-11 NOTE — ED Notes (Signed)
 Taken to [employer].

## 2023-08-11 NOTE — Discharge Instructions (Signed)
 Please follow-up closely with your primary care doctor on an outpatient basis.  Return to emergency department immediately for any new or worsening symptoms.

## 2023-08-11 NOTE — ED Notes (Signed)
 See triage notes. Ambulatory. Nad.

## 2023-08-11 NOTE — ED Triage Notes (Signed)
 Pt presents with lower back pain, per pt she does heavy lifting at work.

## 2024-02-18 ENCOUNTER — Emergency Department (HOSPITAL_COMMUNITY)
Admission: EM | Admit: 2024-02-18 | Discharge: 2024-02-18 | Discharge status: Against Medical Advice | Payer: Self-pay | Attending: Emergency Medicine | Admitting: Emergency Medicine

## 2024-02-18 ENCOUNTER — Encounter (HOSPITAL_COMMUNITY): Payer: Self-pay

## 2024-02-18 ENCOUNTER — Other Ambulatory Visit: Payer: Self-pay

## 2024-02-18 ENCOUNTER — Emergency Department (HOSPITAL_COMMUNITY): Payer: Self-pay

## 2024-02-18 DIAGNOSIS — Z91040 Latex allergy status: Secondary | ICD-10-CM | POA: Insufficient documentation

## 2024-02-18 DIAGNOSIS — F17200 Nicotine dependence, unspecified, uncomplicated: Secondary | ICD-10-CM | POA: Insufficient documentation

## 2024-02-18 DIAGNOSIS — R1012 Left upper quadrant pain: Secondary | ICD-10-CM

## 2024-02-18 DIAGNOSIS — N83202 Unspecified ovarian cyst, left side: Secondary | ICD-10-CM

## 2024-02-18 DIAGNOSIS — R109 Unspecified abdominal pain: Secondary | ICD-10-CM | POA: Insufficient documentation

## 2024-02-18 DIAGNOSIS — R059 Cough, unspecified: Secondary | ICD-10-CM | POA: Insufficient documentation

## 2024-02-18 DIAGNOSIS — R221 Localized swelling, mass and lump, neck: Secondary | ICD-10-CM | POA: Insufficient documentation

## 2024-02-18 LAB — COMPREHENSIVE METABOLIC PANEL WITH GFR
ALT: 19 U/L (ref 0–44)
AST: 25 U/L (ref 15–41)
Albumin: 4.5 g/dL (ref 3.5–5.0)
Alkaline Phosphatase: 72 U/L (ref 38–126)
Anion gap: 13 (ref 5–15)
BUN: 12 mg/dL (ref 6–20)
CO2: 25 mmol/L (ref 22–32)
Calcium: 9.1 mg/dL (ref 8.9–10.3)
Chloride: 101 mmol/L (ref 98–111)
Creatinine, Ser: 0.7 mg/dL (ref 0.44–1.00)
GFR, Estimated: >60 mL/min
Glucose, Bld: 95 mg/dL (ref 70–99)
Potassium: 4.3 mmol/L (ref 3.5–5.1)
Sodium: 139 mmol/L (ref 135–145)
Total Bilirubin: 0.4 mg/dL (ref 0.0–1.2)
Total Protein: 7.4 g/dL (ref 6.5–8.1)

## 2024-02-18 LAB — CBC
HCT: 40.7 % (ref 36.0–46.0)
Hemoglobin: 13.4 g/dL (ref 12.0–15.0)
MCH: 29.8 pg (ref 26.0–34.0)
MCHC: 32.9 g/dL (ref 30.0–36.0)
MCV: 90.6 fL (ref 80.0–100.0)
Platelets: 279 K/uL (ref 150–400)
RBC: 4.49 MIL/uL (ref 3.87–5.11)
RDW: 11.9 % (ref 11.5–15.5)
WBC: 9.5 K/uL (ref 4.0–10.5)
nRBC: 0 % (ref 0.0–0.2)

## 2024-02-18 LAB — LIPASE, BLOOD: Lipase: 39 U/L (ref 11–51)

## 2024-02-18 MED ORDER — IOHEXOL 300 MG/ML  SOLN
100.0000 mL | Freq: Once | INTRAMUSCULAR | Status: AC | PRN
  Administered 2024-02-18: 100 mL via INTRAVENOUS

## 2024-02-18 NOTE — ED Triage Notes (Signed)
 Pt reports left side abdominal pain x 1 month and the pain is worsened by body position and she reports swollen lymph nodes  to her left side neck and back.

## 2024-02-18 NOTE — ED Notes (Signed)
 As per fellow nurse Pt stated that she was leaving. IV taken out at this time. This Nurse did not lay eyes on patient prior to her leaving the ED.

## 2024-02-18 NOTE — ED Provider Notes (Signed)
 " [place] EMERGENCY DEPARTMENT AT [facility] Provider Note   CSN: [id number] Arrival date & time: 02/18/24  1658     Patient presents with: Abdominal Pain   Christine Vargas is a 49 y.o. female.    Abdominal Pain Patient presents with few different complaints.  Has swelling in her left neck/throat area.  States it is painful.  She is smoking about a pack a day.  States she cannot put on weight but that has always been an issue.  Also some cough.  Also left mid abdominal fullness.  States it feels like there is a golf ball in there.  States it is hard palpated.  States she also feels swollen lymph nodes on her lower back.     Prior to Admission medications  Medication Sig Start Date End Date Taking? Authorizing Provider  albuterol  (VENTOLIN  HFA) 108 (90 Base) MCG/ACT inhaler Inhale 2 puffs into the lungs every 4 (four) hours as needed for wheezing or shortness of breath. 07/02/20   Neldon Hamp RAMAN, PA  azithromycin  (ZITHROMAX ) 500 MG tablet Take 1 tablet (500 mg total) by mouth daily. Patient not taking: Reported on 07/02/2020 03/16/16   Antoinette Doe, MD  benzonatate  (TESSALON ) 100 MG capsule Take 1 capsule (100 mg total) by mouth every 8 (eight) hours. Patient not taking: Reported on 03/30/2021 07/02/20   Neldon Hamp RAMAN, PA  methocarbamol  (ROBAXIN ) 500 MG tablet Take 1 tablet (500 mg total) by mouth 2 (two) times daily. 08/11/23   Daralene Lonni BIRCH, PA-C  naproxen  (NAPROSYN ) 375 MG tablet Take 1 tablet (375 mg total) by mouth 2 (two) times daily. 08/11/23   Daralene Lonni BIRCH, PA-C  ondansetron  (ZOFRAN ) 4 MG tablet Take 1 tablet (4 mg total) by mouth every 8 (eight) hours as needed for nausea or vomiting. 03/30/21   Towana Ozell BROCKS, MD  potassium chloride  (KLOR-CON ) 10 MEQ tablet Take 1 tablet (10 mEq total) by mouth daily. 03/30/21   Towana Ozell BROCKS, MD  predniSONE  (STERAPRED UNI-PAK 21 TAB) 10 MG (21) TBPK tablet Take by mouth daily. Take 6 tabs by mouth daily   for 2 days, then 5 tabs for 2 days, then 4 tabs for 2 days, then 3 tabs for 2 days, 2 tabs for 2 days, then 1 tab by mouth daily for 2 days Patient not taking: Reported on 03/30/2021 07/02/20   Neldon Hamp RAMAN, PA  promethazine -dextromethorphan  (PROMETHAZINE -DM) 6.25-15 MG/5ML syrup Take 5 mLs by mouth 4 (four) times daily as needed for cough. Patient not taking: Reported on 03/30/2021 07/02/20   Neldon Hamp RAMAN, PA    Allergies: Bee venom, Ibuprofen, Augmentin [amoxicillin-pot clavulanate], Elastic bandages & [zinc], Gold-containing drug products, Grapefruit concentrate, Latex, Nickel, Other, Sulfa antibiotics, and Tramadol    Review of Systems  Gastrointestinal:  Positive for abdominal pain.    Updated Vital Signs BP (!) 144/63 (BP Location: Right Arm)   Pulse 73   Temp 98.4 F (36.9 C) (Oral)   Resp 18   Wt 47.6 kg   LMP  (LMP Unknown) Comment: stopped 6 months to 1 year ago  SpO2 99%   BMI 18.90 kg/m   Physical Exam Vitals and nursing note reviewed.  Constitutional:      Comments: Patient is rather slender.  HENT:     Mouth/Throat:     Comments: Left neck does have a swollen mass likely lymph node on left mid neck. Pulmonary:     Breath sounds: No wheezing or rhonchi.  Abdominal:  Tenderness: There is no abdominal tenderness.  Neurological:     Mental Status: She is alert.     (all labs ordered are listed, but only abnormal results are displayed) Labs Reviewed  LIPASE, BLOOD  COMPREHENSIVE METABOLIC PANEL WITH GFR  CBC  URINALYSIS, ROUTINE W REFLEX MICROSCOPIC    EKG: None  Radiology: No results found.   Procedures   Medications Ordered in the ED  iohexol  (OMNIPAQUE ) 300 MG/ML solution 100 mL (100 mLs Intravenous Contrast Given 02/18/24 2111)                                    Medical Decision Making Amount and/or Complexity of Data Reviewed Labs: ordered. Radiology: ordered.  Risk Prescription drug management.   Patient is a relatively  heavy smoker.  Does have swelling in her neck abdominal pain.  Differential diagnosis does include malignancy.  Blood work done and overall reassuring.  Denies pregnancy.  Will get CT scan of neck chest abdomen pelvis to evaluate.  Blood work reassuring.  Both the CT of the neck and chest abdomen pelvis did show some nodular opacities in the lungs.  However has chronic cough with morning sputum production.  Do not think this is an acute infection and likely related to her heavy smoking.  Does have an ovarian cyst can get followed.  Patient states her pain is more in the upper abdomen.  Can follow-up with PCP.  No swelling seen in neck on the CT scan.  Will discharge home.     Final diagnoses:  None    ED Discharge Orders     None          Patsey Lot, MD 02/18/24 2231  "

## 2024-02-18 NOTE — ED Notes (Addendum)
 Attempted an IV, pt continuously jerked their arm and then told staff  You did that wrong, I need someone else. Staffed asked someone else to attempt IV.

## 2024-02-18 NOTE — ED Notes (Signed)
 Patient transported to CT

## 2024-02-18 NOTE — Discharge Instructions (Signed)
 There was a cyst on your left ovary.  Dr. Orpha can help arrange this.  The scan was otherwise reassuring.
